# Patient Record
Sex: Female | Born: 1940 | Race: Black or African American | Hispanic: No | Marital: Married | Smoking: Former smoker
Health system: Southern US, Community
[De-identification: ages and names within clinical notes are randomized; demographics above are authoritative.]

## PROBLEM LIST (undated history)

## (undated) DIAGNOSIS — I10 Essential (primary) hypertension: Secondary | ICD-10-CM

## (undated) DIAGNOSIS — R0602 Shortness of breath: Secondary | ICD-10-CM

## (undated) DIAGNOSIS — I2510 Atherosclerotic heart disease of native coronary artery without angina pectoris: Secondary | ICD-10-CM

## (undated) DIAGNOSIS — E785 Hyperlipidemia, unspecified: Secondary | ICD-10-CM

## (undated) DIAGNOSIS — D693 Immune thrombocytopenic purpura: Secondary | ICD-10-CM

## (undated) DIAGNOSIS — T733XXA Exhaustion due to excessive exertion, initial encounter: Secondary | ICD-10-CM

## (undated) HISTORY — DX: Atherosclerotic heart disease of native coronary artery without angina pectoris: I25.10

## (undated) HISTORY — DX: Shortness of breath: R06.02

## (undated) HISTORY — PX: OTHER SURGICAL HISTORY: SHX169

## (undated) HISTORY — DX: Exhaustion due to excessive exertion, initial encounter: T73.3XXA

## (undated) HISTORY — DX: Hyperlipidemia, unspecified: E78.5

## (undated) HISTORY — DX: Immune thrombocytopenic purpura: D69.3

## (undated) HISTORY — DX: Essential (primary) hypertension: I10

---

## 1998-12-12 ENCOUNTER — Other Ambulatory Visit: Admission: RE | Admit: 1998-12-12 | Discharge: 1998-12-12 | Payer: Self-pay | Admitting: Obstetrics and Gynecology

## 1998-12-27 ENCOUNTER — Other Ambulatory Visit: Admission: RE | Admit: 1998-12-27 | Discharge: 1998-12-27 | Payer: Self-pay | Admitting: Obstetrics and Gynecology

## 1999-12-18 ENCOUNTER — Other Ambulatory Visit: Admission: RE | Admit: 1999-12-18 | Discharge: 1999-12-18 | Payer: Self-pay | Admitting: Obstetrics and Gynecology

## 2000-01-21 ENCOUNTER — Other Ambulatory Visit: Admission: RE | Admit: 2000-01-21 | Discharge: 2000-01-21 | Payer: Self-pay | Admitting: Obstetrics & Gynecology

## 2000-01-22 ENCOUNTER — Other Ambulatory Visit: Admission: RE | Admit: 2000-01-22 | Discharge: 2000-01-22 | Payer: Self-pay | Admitting: Obstetrics & Gynecology

## 2000-01-22 ENCOUNTER — Encounter (INDEPENDENT_AMBULATORY_CARE_PROVIDER_SITE_OTHER): Payer: Self-pay | Admitting: Specialist

## 2000-09-16 ENCOUNTER — Ambulatory Visit (HOSPITAL_COMMUNITY): Admission: RE | Admit: 2000-09-16 | Discharge: 2000-09-16 | Payer: Self-pay | Admitting: Internal Medicine

## 2000-09-16 ENCOUNTER — Encounter: Payer: Self-pay | Admitting: Internal Medicine

## 2000-10-22 ENCOUNTER — Ambulatory Visit (HOSPITAL_COMMUNITY): Admission: RE | Admit: 2000-10-22 | Discharge: 2000-10-22 | Payer: Self-pay | Admitting: Gastroenterology

## 2000-12-17 ENCOUNTER — Other Ambulatory Visit: Admission: RE | Admit: 2000-12-17 | Discharge: 2000-12-17 | Payer: Self-pay | Admitting: Obstetrics and Gynecology

## 2001-02-10 ENCOUNTER — Other Ambulatory Visit: Admission: RE | Admit: 2001-02-10 | Discharge: 2001-02-10 | Payer: Self-pay | Admitting: Obstetrics and Gynecology

## 2001-02-11 ENCOUNTER — Other Ambulatory Visit: Admission: RE | Admit: 2001-02-11 | Discharge: 2001-02-11 | Payer: Self-pay | Admitting: Obstetrics and Gynecology

## 2001-02-11 ENCOUNTER — Encounter (INDEPENDENT_AMBULATORY_CARE_PROVIDER_SITE_OTHER): Payer: Self-pay

## 2002-01-04 ENCOUNTER — Other Ambulatory Visit: Admission: RE | Admit: 2002-01-04 | Discharge: 2002-01-04 | Payer: Self-pay | Admitting: Obstetrics and Gynecology

## 2002-03-26 ENCOUNTER — Ambulatory Visit (HOSPITAL_COMMUNITY): Admission: RE | Admit: 2002-03-26 | Discharge: 2002-03-26 | Payer: Self-pay | Admitting: Orthopaedic Surgery

## 2002-03-26 ENCOUNTER — Encounter: Payer: Self-pay | Admitting: Orthopaedic Surgery

## 2003-01-07 ENCOUNTER — Other Ambulatory Visit: Admission: RE | Admit: 2003-01-07 | Discharge: 2003-01-07 | Payer: Self-pay | Admitting: Obstetrics and Gynecology

## 2003-02-21 ENCOUNTER — Ambulatory Visit (HOSPITAL_COMMUNITY): Admission: RE | Admit: 2003-02-21 | Discharge: 2003-02-21 | Payer: Self-pay | Admitting: Obstetrics and Gynecology

## 2003-02-21 ENCOUNTER — Encounter (INDEPENDENT_AMBULATORY_CARE_PROVIDER_SITE_OTHER): Payer: Self-pay | Admitting: *Deleted

## 2003-05-30 ENCOUNTER — Other Ambulatory Visit: Admission: RE | Admit: 2003-05-30 | Discharge: 2003-05-30 | Payer: Self-pay | Admitting: Obstetrics and Gynecology

## 2004-01-11 ENCOUNTER — Other Ambulatory Visit: Admission: RE | Admit: 2004-01-11 | Discharge: 2004-01-11 | Payer: Self-pay | Admitting: Obstetrics and Gynecology

## 2007-03-27 ENCOUNTER — Inpatient Hospital Stay (HOSPITAL_COMMUNITY): Admission: RE | Admit: 2007-03-27 | Discharge: 2007-03-28 | Payer: Self-pay | Admitting: Cardiovascular Disease

## 2007-06-18 ENCOUNTER — Encounter (HOSPITAL_COMMUNITY): Admission: RE | Admit: 2007-06-18 | Discharge: 2007-07-29 | Payer: Self-pay | Admitting: Cardiovascular Disease

## 2007-07-30 ENCOUNTER — Encounter (HOSPITAL_COMMUNITY): Admission: RE | Admit: 2007-07-30 | Discharge: 2007-10-28 | Payer: Self-pay | Admitting: Cardiovascular Disease

## 2007-10-29 ENCOUNTER — Encounter (HOSPITAL_COMMUNITY): Admission: RE | Admit: 2007-10-29 | Discharge: 2007-11-02 | Payer: Self-pay | Admitting: Cardiovascular Disease

## 2009-03-11 ENCOUNTER — Emergency Department (HOSPITAL_COMMUNITY): Admission: EM | Admit: 2009-03-11 | Discharge: 2009-03-11 | Payer: Self-pay | Admitting: Emergency Medicine

## 2009-06-13 ENCOUNTER — Emergency Department (HOSPITAL_COMMUNITY): Admission: EM | Admit: 2009-06-13 | Discharge: 2009-06-13 | Payer: Self-pay | Admitting: Emergency Medicine

## 2009-07-31 ENCOUNTER — Emergency Department (HOSPITAL_COMMUNITY): Admission: EM | Admit: 2009-07-31 | Discharge: 2009-07-31 | Payer: Self-pay | Admitting: Emergency Medicine

## 2009-11-08 ENCOUNTER — Ambulatory Visit: Payer: Self-pay | Admitting: Oncology

## 2009-11-22 LAB — COMPREHENSIVE METABOLIC PANEL
ALT: 25 U/L (ref 0–35)
Albumin: 3.8 g/dL (ref 3.5–5.2)
CO2: 24 mEq/L (ref 19–32)
Calcium: 9.1 mg/dL (ref 8.4–10.5)
Chloride: 106 mEq/L (ref 96–112)
Glucose, Bld: 182 mg/dL — ABNORMAL HIGH (ref 70–99)
Potassium: 4.3 mEq/L (ref 3.5–5.3)
Sodium: 138 mEq/L (ref 135–145)
Total Bilirubin: 0.5 mg/dL (ref 0.3–1.2)
Total Protein: 6.9 g/dL (ref 6.0–8.3)

## 2009-11-22 LAB — CBC WITH DIFFERENTIAL/PLATELET
BASO%: 0.5 % (ref 0.0–2.0)
Basophils Absolute: 0 10*3/uL (ref 0.0–0.1)
EOS%: 0.4 % (ref 0.0–7.0)
Eosinophils Absolute: 0 10*3/uL (ref 0.0–0.5)
HCT: 40.8 % (ref 34.8–46.6)
HGB: 14.2 g/dL (ref 11.6–15.9)
LYMPH%: 19.7 % (ref 14.0–49.7)
MCH: 33.4 pg (ref 25.1–34.0)
MCHC: 34.7 g/dL (ref 31.5–36.0)
MCV: 96.2 fL (ref 79.5–101.0)
MONO#: 0.3 10*3/uL (ref 0.1–0.9)
MONO%: 4.7 % (ref 0.0–14.0)
NEUT#: 4.5 10*3/uL (ref 1.5–6.5)
NEUT%: 74.7 % (ref 38.4–76.8)
Platelets: 238 10*3/uL (ref 145–400)
RBC: 4.24 10*6/uL (ref 3.70–5.45)
RDW: 13.1 % (ref 11.2–14.5)
WBC: 6 10*3/uL (ref 3.9–10.3)
lymph#: 1.2 10*3/uL (ref 0.9–3.3)

## 2009-11-22 LAB — CHCC SMEAR

## 2009-11-24 LAB — KAPPA/LAMBDA LIGHT CHAINS
Kappa free light chain: 0.87 mg/dL (ref 0.33–1.94)
Kappa:Lambda Ratio: 0.76 (ref 0.26–1.65)

## 2009-11-24 LAB — SPEP & IFE WITH QIG
Albumin ELP: 56.4 % (ref 55.8–66.1)
Alpha-1-Globulin: 8.7 % — ABNORMAL HIGH (ref 2.9–4.9)
Beta 2: 4.5 % (ref 3.2–6.5)
Beta Globulin: 6.7 % (ref 4.7–7.2)
IgA: 160 mg/dL (ref 68–378)
Total Protein, Serum Electrophoresis: 7 g/dL (ref 6.0–8.3)

## 2009-11-29 LAB — UIFE/LIGHT CHAINS/TP QN, 24-HR UR
Albumin, U: DETECTED
Alpha 1, Urine: DETECTED — AB
Alpha 2, Urine: DETECTED — AB
Beta, Urine: DETECTED — AB
Free Kappa Lt Chains,Ur: 0.79 mg/dL (ref 0.04–1.51)
Free Lambda Lt Chains,Ur: <0.04 mg/dL — ABNORMAL LOW (ref 0.08–1.01)
Free Lt Chn Excr Rate: 7.51 mg/d
Time: 24 hours
Total Protein, Urine-Ur/day: 12 mg/d (ref 10–140)
Total Protein, Urine: 1.3 mg/dL
Volume, Urine: 950 mL

## 2009-12-01 ENCOUNTER — Ambulatory Visit (HOSPITAL_COMMUNITY): Admission: RE | Admit: 2009-12-01 | Discharge: 2009-12-01 | Payer: Self-pay | Admitting: Oncology

## 2010-02-12 ENCOUNTER — Ambulatory Visit: Payer: Self-pay | Admitting: Oncology

## 2010-05-15 ENCOUNTER — Ambulatory Visit: Payer: Self-pay | Admitting: Oncology

## 2010-10-31 LAB — CBC
HCT: 35.8 % — ABNORMAL LOW (ref 36.0–46.0)
Hemoglobin: 12.4 g/dL (ref 12.0–15.0)
RDW: 12.5 % (ref 11.5–15.5)
WBC: 9 10*3/uL (ref 4.0–10.5)

## 2010-10-31 LAB — URINALYSIS, ROUTINE W REFLEX MICROSCOPIC
Glucose, UA: NEGATIVE mg/dL
Hgb urine dipstick: NEGATIVE
Protein, ur: NEGATIVE mg/dL
Specific Gravity, Urine: 1.011 (ref 1.005–1.030)

## 2010-10-31 LAB — BASIC METABOLIC PANEL
Chloride: 104 mEq/L (ref 96–112)
GFR calc non Af Amer: 53 mL/min — ABNORMAL LOW (ref >60)
Glucose, Bld: 94 mg/dL (ref 70–99)
Potassium: 3.7 mEq/L (ref 3.5–5.1)
Sodium: 137 mEq/L (ref 135–145)

## 2010-10-31 LAB — DIFFERENTIAL
Eosinophils Relative: 2 % (ref 0–5)
Lymphocytes Relative: 11 % — ABNORMAL LOW (ref 12–46)
Lymphs Abs: 1 10*3/uL (ref 0.7–4.0)
Monocytes Absolute: 0.4 10*3/uL (ref 0.1–1.0)

## 2010-10-31 LAB — POCT CARDIAC MARKERS
CKMB, poc: 2.4 ng/mL (ref 1.0–8.0)
Myoglobin, poc: 311 ng/mL (ref 12–200)
Troponin i, poc: <0.05 ng/mL (ref 0.00–0.09)

## 2010-11-03 LAB — POCT URINALYSIS DIP (DEVICE)
Glucose, UA: NEGATIVE mg/dL
Nitrite: POSITIVE — AB
Urobilinogen, UA: 1 mg/dL (ref 0.0–1.0)

## 2010-11-03 LAB — URINE CULTURE

## 2010-12-11 NOTE — Cardiovascular Report (Signed)
[name], [name]           ACCOUNT NO.:  [id number]   MEDICAL RECORD NO.:  [id number]          PATIENT TYPE:  OIB   LOCATION:  2807                         FACILITY:  [facility]   PHYSICIAN:  Nicki Guadalajara, M.D.     DATE OF BIRTH:  [birth year]   DATE OF PROCEDURE:  03/26/2007  DATE OF DISCHARGE:                            CARDIAC CATHETERIZATION   Jamie Mejia is a 70 year old African American female who has a  history of hypertension, family history of coronary artery disease, and  recently developed exertional dyspnea with vague chest pressure.  She  was found to be significantly hyperlipidemic and started on Crestor 20  mg.  Definitive cardiac catheterization was done earlier today at  [facility].  This revealed calcification of the left main  LAD system with 50% narrowing in a twin diagonal branch of the LAD.  She  has 50-70% diffuse narrowing in the second obtuse marginal branch of the  circumflex vessel.  Right coronary artery was diffusely diseased.  There  was 95-99% stenosis in the mid segment followed by 70-80% stenosis.  The  60-70% stenosis after the crux.  The PDA initially appeared fairly small  caliber.  She was also found to have 70-80% focal left renal artery  stenosis.  Because of her subtotal RCA stenosis even though she was not  having chest pain the decision was made to bring her to [facility] this afternoon for elective percutaneous coronary artery  intervention of her subtotal RCA lesion.  The patient was hydrated with  175 mL of saline after her diagnostic catheterization at the [facility].  Upon arrival to the short stay area, she was given 300 mg  of oral Plavix.   PROCEDURE:  After premedication with Valium 5 mg intravenous, the  patient was prepped and draped in usual fashion.  Her right femoral  artery was punctured anteriorly and a 6-French sheath was inserted.  Initially, an LR-4 guide was inserted.  The  Integralin double bolus and  drip was started and the patient was given 3500 units of heparin.  She  was also administered IC nitroglycerin and started on IV nitroglycerin  drip.  Initially an ATW wire was attempted to be advanced down the right  coronary artery but this was unable to cross the lesion.  A 2-0 Maverick  balloon x 12 mm was then inserted but again this was unable to traverse  the 90 degrees angle proximally and there was significant difficulty  with catheter backup.  Consequently, the entire system was removed and a  hockey-stick with side hole 6-French guide was inserted.  This did  provide additional support, but still there was some difficulty in  access.  Ultimately a long Luge wire was inserted.  Again this was  unable to cross the lesion but ultimately once the 2-0 balloon was then  inserted the subtotal lesion was ultimately able to be crossed.  The  wire was advanced distally.  Dilatation was done up to 8 atmospheres for  several inflations at the high-grade subtotal lesion.  The balloon was  then pulled back  and removed and exchanged for a 2.5 x 15 mm balloon.  However, there was again difficult backup and when this balloon was  attempted to enter the system, the wire pulled back and lesion access  was lost.  A new wire was then inserted.  There was significant  difficulty again in attempting to re-cross the lesion.  A Prowater wire  was then inserted but again this was unsuccessful in crossing the  lesion.  Ultimately, a new Luge wire was then advanced and the initial 2-  0 balloon was ultimately successful again in allowing the lesion to be  recrossed.  Multiple dilatations again were made with the 2-0 balloon.  This balloon was then removed and a 2-5 x 15 mm balloon was then able to  be advanced to the lesion with successive dilatations up to 12  atmospheres.  Due to the sharp angle in the proximal right coronary  artery it was felt that the PROMOS stent would be  most deliverable.  A 3-  0 x 23 mm PROMOS drug-eluting stent was then successfully inserted and  deployed to 15 atmospheres.  The post-stent dilatation was done  ultimately with a  3.25 x 20 mm Quantum balloon with dilatation up to  3.3 mm.  During the procedure, the patient received numerous doses of IC  nitroglycerin.  She also received an additional 25 mg fentanyl.  With  balloon inflation, she did not experience any significant chest  discomfort.  She tolerated the procedure well.   HEMODYNAMIC DATA:  Central aortic pressure was 147/72.   ANGIOGRAPHIC DATA:  The right coronary artery had a somewhat upper  takeoff, long proximal segment and then had a 90 degree takeoff in areas  where small anterior branches arose.  There was a 50% lesion prior to a  marginal branch and then the RCA was 99% stenosed.  There was aneurysmal  dilatation above and below this stenosed segment followed by 80%  narrowing.  There was 40% narrowing beyond the crux.  There appeared to  be at least diffuse 40% narrowing in the PDA.  Following a difficult but  ultimately successful coronary intervention to the right coronary artery  the diffusely diseased proximal segment was successfully angioplastied  and stented ultimately with a 3-0 x 23 mm PROMUS stent postdilated to  3.3 mm with resultant stenosis being reduced to 0%.  There is no change  in the more distal RCA lesions.   IMPRESSION:  Difficult but successful percutaneous coronary artery  intervention to the right coronary artery with diffuse 99% 80% stenoses  ultimately being reduced to 0% with insertion of a 3-0 x 3.0 x 23-mm  drug-eluting new PROMUS stent postdilated 3.3 cm,  done with double  bolus Integralin/weight adjusted heparinization and oral clopidogrel.           ______________________________  Nicki Guadalajara, M.D.     TK/MEDQ  D:  03/26/2007  T:  03/27/2007  Job:  [id number]   cc:   Harrel Lemon. Merla Riches, M.D.  [facility]. Lab.

## 2010-12-14 NOTE — Discharge Summary (Signed)
NAMEFRANCI, Jamie Mejia           ACCOUNT NO.:  [id number]   MEDICAL RECORD NO.:  [id number]          PATIENT TYPE:  INP   LOCATION:  6527                         FACILITY:  [facility]   PHYSICIAN:  Jamie Mejia, M.D.     DATE OF BIRTH:  [birth year]   DATE OF ADMISSION:  03/26/2007  DATE OF DISCHARGE:  03/28/2007                               DISCHARGE SUMMARY   DISCHARGE DIAGNOSES:  1. Non-ST-elevation myocardial infarction, minimal.  2. Coronary artery disease with 95.99% right coronary artery stenosis,      undergoing percutaneous transluminal cardiac angioplasty and stent      by Dr. Tresa Mejia.  3. Hypertension.  4. Family history of coronary disease.  5. Angina.  6. Gastroesophageal reflux disease.  7. A 70-80% left renal artery stenosis.   DISCHARGE CONDITION:  Improved.   PROCEDURES:  Initially had cardiac catheterization at [facility]  March 26, 2007, and was transferred to [facility] where she underwent PTCA  and stent deployment to the right coronary artery for tight stenosis.   DISCHARGE MEDICATIONS:  1. Toprol XL 50 mg daily.  2. Imdur 30 mg daily.  3. Aspirin 325 daily.  4. Nexium 40 mg daily.  5. Wellbutrin XL150 mg daily.  6. Crestor 20 mg daily.  7. Benicar/hydrochlorothiazide 20/12.5 daily.  8. If you are unable to get the Nexium, use Prilosec over-the-counter      one daily for 1-2 weeks.  9. Plavix 75 mg daily.  Do not stop.  10.Nitroglycerin under your tongue as needed for chest pain.   DISCHARGE INSTRUCTIONS:  1. May shower or bathe.  2. No lifting for 2 days.  3. No driving for 2 days.  4. Increase activity slowly.  5. Low-sodium, heart-healthy diet.  6. Wash right groin catheterization site with soap and water.  7. Call if any bleeding, swelling or drainage.  8. See Dr. Tresa Mejia back in 2 weeks.  9. She is to bring all her medications with her to the doctor's      appointments as there was still some confusion concerning      medications.   HISTORY OF  PRESENT ILLNESS:  Ms. Jamie Mejia is a 70 year old patient of Dr.  Tresa Mejia who originally was seen in 2008. Because of chest discomfort, she  was scheduled for stress Myoview which suggested mild perfusion defect  in the inferior wall with possible inferior lateral ischemia.  Post  stress EF was 79%. She also on echocardiogram revealed mild mitral  annular calcification with mild MR, trace TR, and mild aortic valve  sclerosis but no stenosis.  Due to her chest discomfort, Toprol XL was  added to her medical regimen, and she underwent cardiac catheterization  at [facility] with Dr. Tresa Mejia. At that time, she was found to have  95.99% RCA stenosis and was brought to [facility] the same day and  underwent PCI with stent to that vessel.   Please see both dictated reports. By the next morning, she was feeling  good with no chest pain or shortness of breath.  Unfortunately, troponin  had peaked, and it was felt she needed to stay  in the hospital which she  did.  By the next day, she was ambulating the hall without any further  problems and was ready for discharge home.  She was seen and examined by  Dr. Lynnea Mejia and discharged.   VITAL SIGNS AT DISCHARGE:  Blood pressure 135/60, pulse 69, temperature  98.6, respirations 18.   Alert, oriented.  Heart:  Regular rate and rhythm.  Lungs were clear.  Groin stable without hematoma.   LABORATORY DATA:  Hemoglobin 11.9, hematocrit 34.7, WBC 9, platelets  218.  Sodium 141, potassium 3.9, chloride 108, CO2 26, glucose 96, BUN  8, creatinine 0.95   Cardiac enzymes: CK ranged 136, 151, 134. MB 4.7, 5.1, and 3.2.  Troponin I 0.16, 0.28, and then down to 0.2 prior to discharge.   The patient will follow up with Dr. Tresa Mejia.  She walked with cardiac  rehab and did well on the morning of discharge. She had no further  complaints and felt quite well.      Jamie Mejia. Jamie Mejia, N.P.    ______________________________  Jamie Mejia, M.D.    LRI/MEDQ  D:   03/31/2007  T:  03/31/2007  Job:  [id number]   cc:   Jamie Parkin, MD

## 2010-12-14 NOTE — Procedures (Signed)
[place]  Patient:    Jamie Mejia, Jamie Mejia                    MRN: [id number] Proc. Date: 10/22/00 Adm. Date:  [id number] Attending:  Charna Elizabeth CC:         Merlene Laughter. [name], M.D.   Procedure Report  DATE OF BIRTH:  Feb 19, 1941.  PROCEDURE:  Colonoscopy.  ENDOSCOPIST:  Anselmo Rod, M.D.  INSTRUMENT USED:  Olympus video colonoscope.  INDICATION FOR PROCEDURE:  Rectal bleeding and a history of polyps in a 70 year old African-American female.  Rule out recurrent polyps.  PREPROCEDURE PREPARATION:  Informed consent was procured from the patient. The patient was fasted for eight hours prior to the procedure and prepped with a bottle of magnesium citrate and a gallon of NuLytely the night prior to the procedure.  PREPROCEDURE PHYSICAL:  VITAL SIGNS:  The patient had stable vital signs.  NECK:  Supple.  CHEST:  Clear to auscultation.  S1, S2 regular.  ABDOMEN:  Soft with normal abdominal bowel sounds.  DESCRIPTION OF PROCEDURE:  The patient was placed in the left lateral decubitus position and sedated with 50 mg of Demerol and 6 mg of Versed intravenously.  Once the patient was adequately sedate and maintained on low-flow oxygen and continuous cardiac monitoring, the Olympus video colonoscope was advanced from the rectum to the cecum without difficulty.  The entire colonic mucosa appeared healthy with normal vascular pattern.  No masses, polyps, erosions, ulcerations, or diverticula were seen.  Small internal hemorrhoids were appreciated on retroflexion.  IMPRESSION:  Healthy-appearing colon except for small, nonbleeding internal hemorrhoids.  RECOMMENDATIONS: 1. Repeat colorectal cancer screening is recommended in the next five years    unless the patient were to have any abdominal symptoms in the interim. 2. If the patients rectal bleeding does not subside with high-fiber diet and    liberal fluid intake, other workup  will be needed.  The patient is to    contact the office at the earliest. 3. Outpatient follow-up is advised on a p.r.n. basis. DD:  10/22/00 TD:  10/23/00 Job: [id number]

## 2010-12-14 NOTE — Op Note (Signed)
Jamie Mejia, Jamie Mejia                     ACCOUNT NO.:  [id number]   MEDICAL RECORD NO.:  [id number]                   PATIENT TYPE:  AMB   LOCATION:  SDC                                  FACILITY:  [facility]   PHYSICIAN:  Hal Morales, M.D.             DATE OF BIRTH:  Oct 02, 1940   DATE OF PROCEDURE:  02/21/2003  DATE OF DISCHARGE:                                 OPERATIVE REPORT   PREOPERATIVE DIAGNOSES:  High grade squamous intra-epithelial lesion,  cervical stenosis, inadequate colposcopy.   POSTOPERATIVE DIAGNOSES:  High grade squamous intra-epithelial lesion,  cervical stenosis, inadequate colposcopy.   OPERATION/PROCEDURE:  Cone knife conization of the cervix.   ANESTHESIA:  General.   ESTIMATED BLOOD LOSS:  Less than 10 cc.   COMPLICATIONS:  None.   FINDINGS:  There were no visible lesions on the exocervix per the previous  colposcopy.  The cervical os was stenotic and precluded obtaining and endo-  cervical curetting.  The transition zone could not be fully visualized at  the time of colposcopy.   DESCRIPTION OF PROCEDURE:  The patient was taken to the operating room after  appropriate identification and placed on the operating table.  After the  obtainment of adequate anesthesia, she was placed in a lithotomy position.  The peroneum and vagina were prepped in multiple layers with Betadine and  draped as a sterile field.  A Grave's speculum was placed into the vagina  and a single-toothed tenaculum placed on the cervix outside the expected  transition zone.  A dilute solution of Pitressin was used to infiltrate the  cervix.  Hemostatic sutures were placed at the 3 and 9 o'clock position and  tied down.  A cone-shaped specimen was then excised including the endo-  cervical canal and was removed from the operative field.  The endocervical  canal was then curetted and sent as a separate specimen.  Hemostatic  Sturmdorf sutures were placed at the 6 and 12 o'clock  positions and  hemostasis noted to be adequate.  The lower portion of the cervix was  oversewn at the cut edge to allow for adequate hemostasis.  A portion of  Gelfoam was placed in the conization bed.  Hemostasis was adequate and all  instruments were removed from the operative field.  All sutures used were 0  Vicryl.  The patient was awakened from general anesthesia and taken to the  recovery room in satisfactory condition having tolerated the procedure well  with sponge and instrument counts were correct.   SPECIMENS TO PATHOLOGY:  Conization specimen with suture at the 12 o'clock  position and endo-cervical specimen.                                               Hal Morales, M.D.   VPH/MEDQ  D:  02/21/2003  T:  02/21/2003  Job:  [id number]

## 2011-05-10 LAB — CARDIAC PANEL(CRET KIN+CKTOT+MB+TROPI)
CK, MB: 3.2
CK, MB: 4.7 — ABNORMAL HIGH
CK, MB: 5.1 — ABNORMAL HIGH
Relative Index: 3.5 — ABNORMAL HIGH
Total CK: 134
Troponin I: 0.24 — ABNORMAL HIGH

## 2011-05-10 LAB — BASIC METABOLIC PANEL
CO2: 26
GFR calc Af Amer: >60
GFR calc non Af Amer: 59 — ABNORMAL LOW
Glucose, Bld: 96
Potassium: 3.9
Sodium: 141

## 2011-05-10 LAB — CBC
HCT: 32.2 — ABNORMAL LOW
Hemoglobin: 11.2 — ABNORMAL LOW
Hemoglobin: 11.9 — ABNORMAL LOW
MCHC: 34.8
RBC: 3.36 — ABNORMAL LOW
RBC: 3.61 — ABNORMAL LOW
RDW: 12.7

## 2012-04-08 ENCOUNTER — Other Ambulatory Visit: Payer: Self-pay | Admitting: Internal Medicine

## 2012-04-08 ENCOUNTER — Ambulatory Visit
Admission: RE | Admit: 2012-04-08 | Discharge: 2012-04-08 | Disposition: A | Discharge status: Home / Self Care | Payer: Medicare Other | Source: Ambulatory Visit | Attending: Internal Medicine | Admitting: Internal Medicine

## 2012-04-08 DIAGNOSIS — R079 Chest pain, unspecified: Secondary | ICD-10-CM

## 2013-02-26 ENCOUNTER — Telehealth: Payer: Self-pay | Admitting: Hematology & Oncology

## 2013-02-26 NOTE — Telephone Encounter (Signed)
REFERRAL FAXED TO [facility].

## 2013-03-01 ENCOUNTER — Ambulatory Visit (HOSPITAL_BASED_OUTPATIENT_CLINIC_OR_DEPARTMENT_OTHER): Payer: Medicare Other | Admitting: Hematology & Oncology

## 2013-03-01 ENCOUNTER — Ambulatory Visit: Payer: Medicare Other

## 2013-03-01 ENCOUNTER — Other Ambulatory Visit (HOSPITAL_BASED_OUTPATIENT_CLINIC_OR_DEPARTMENT_OTHER): Payer: Medicare Other | Admitting: Lab

## 2013-03-01 VITALS — BP 102/61 | HR 71 | Temp 97.9°F | Resp 20 | Ht 58.5 in | Wt 144.0 lb

## 2013-03-01 DIAGNOSIS — I2510 Atherosclerotic heart disease of native coronary artery without angina pectoris: Secondary | ICD-10-CM

## 2013-03-01 DIAGNOSIS — D693 Immune thrombocytopenic purpura: Secondary | ICD-10-CM

## 2013-03-01 DIAGNOSIS — D696 Thrombocytopenia, unspecified: Secondary | ICD-10-CM

## 2013-03-01 DIAGNOSIS — I10 Essential (primary) hypertension: Secondary | ICD-10-CM

## 2013-03-01 LAB — CBC WITH DIFFERENTIAL (CANCER CENTER ONLY)
BASO%: 0.5 % (ref 0.0–2.0)
EOS%: 8 % — ABNORMAL HIGH (ref 0.0–7.0)
Eosinophils Absolute: 0.5 10*3/uL (ref 0.0–0.5)
LYMPH%: 39.3 % (ref 14.0–48.0)
MCH: 32 pg (ref 26.0–34.0)
MCV: 92 fL (ref 81–101)
MONO%: 9.7 % (ref 0.0–13.0)
NEUT#: 2.8 10*3/uL (ref 1.5–6.5)
Platelets: 10 10*3/uL — CL (ref 145–400)
RBC: 4.35 10*6/uL (ref 3.70–5.32)
RDW: 12.4 % (ref 11.1–15.7)

## 2013-03-01 LAB — CHCC SATELLITE - SMEAR

## 2013-03-01 NOTE — Progress Notes (Signed)
This office note has been dictated.

## 2013-03-02 LAB — LUPUS ANTICOAGULANT PANEL
DRVVT: 31.5 s
Lupus Anticoagulant: NOT DETECTED
PTT Lupus Anticoagulant: 35 s (ref 28.0–43.0)

## 2013-03-02 LAB — ANA: Anti Nuclear Antibody(ANA): NEGATIVE

## 2013-03-02 LAB — RHEUMATOID FACTOR: Rheumatoid fact SerPl-aCnc: <10 [IU]/mL

## 2013-03-02 LAB — CARDIOLIPIN ANTIBODIES, IGG, IGM, IGA
Anticardiolipin IgA: 9 APL U/mL (ref <22)
Anticardiolipin IgG: 5 GPL U/mL (ref <23)
Anticardiolipin IgM: 17 MPL U/mL — ABNORMAL HIGH (ref <11)

## 2013-03-02 NOTE — Progress Notes (Signed)
CC:   Jamie Mejia. [name], M.D.  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  HISTORY OF PRESENT ILLNESS:  Jamie Mejia is a very charming 72 year old African American female.  She is followed by Dr. Kellie Shropshire.  Her husband recently underwent a stem cell transplant at [facility] for myeloma.  Jamie Mejia has been doing fairly well.  She has not had any real acute issues going on.  She has had no recent viral syndromes.  She has had no problems with bleeding or bruising.  She was seen by Dr. [name].  She had some lab work done back in July. Shockingly enough, her platelet count was found to be quite low.  Back on 07/18, her white cell count was 6.6, hemoglobin 14.7, hematocrit 31.2, platelet count 25,000.  She had normal electrolytes.  Calcium was 10.1.  Protein was 7.5.  She had normal LFTs.  On 07/29, a repeat CBC was done.  This showed a white count of 5.2, hemoglobin 15.5, hematocrit 40.1, platelet count was 17,000.  MCV was 91.  With this, it was felt that she needed to see hematology.  She was kindly referred to [facility] for an evaluation.  Again, she has had no bleeding.  There may be some bruising.  She has had no rashes.  The patient is on aspirin and Plavix for a cardiac stent that was placed a couple years ago.  She has had no weight loss or weight gain.  She does state she has had hot flashes for the past 2 months.  She has not had these before.  There has been no change in bowel or bladder habits.  She has been getting routine mammograms.  She is due I think later on this year.  She denies any change in her medications.  Overall, her performance status is quite good (ECOG 1).  PAST MEDICAL HISTORY: 1. Coronary artery disease, status post cardiac stent. 2. Hyperlipidemia. 3. Hypertension. 4. 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 5. Arthritis. 6. Anxiety.  ALLERGIES: 1. Latex. 2. Peanut butter.  MEDICATIONS:  Xanax  1 mg p.o. b.i.d. p.r.n., aspirin 81 mg p.o. daily, Wellbutrin XL 150 mg p.o. daily, Plavix 75 mg p.o. daily, Imdur 30 mg p.o. daily, Toprol-XL 25 mg p.o. daily, Zocor 20 mg p.o. daily, Flexeril 10 mg p.o. q.h.s. p.r.n.  SOCIAL HISTORY:  Remarkable for past tobacco use.  She probably stopped about I think 10 years ago.  She says that she may light up one if she has stress.  There is no alcohol use.  She has had no occupational exposures.  She is retired from the administration at [employer].  FAMILY HISTORY:  Pretty much noncontributory.  There is no obvious history of blood problems in the family.  REVIEW OF SYSTEMS:  As stated in the history of present illness.  No additional findings are noted on a 12-system review.  PHYSICAL EXAMINATION:  General:  This is a well-developed, well- nourished African American female in no obvious distress.  Vital signs: Temperature of 97.9, pulse 71, respiratory 20, blood pressure 102/61. Weight is 144.  Head and neck:  Normocephalic, atraumatic skull.  There are no ocular or oral lesions.  There are no palpable cervical or supraclavicular lymph nodes.  Lungs:  Clear bilaterally.  Cardiac: Regular rate and rhythm with a normal S1 and S2.  There are no murmurs, rubs or bruits.  Abdomen:  Soft.  She has good bowel sounds.  There is no fluid wave.  There is no  palpable hepatosplenomegaly.  Back:  Shows no tenderness over the spine, ribs, or hips.  Extremities:  Show no clubbing, cyanosis or edema.  Skin:  Shows no rashes, ecchymosis, or petechia.  Neurological:  Shows no focal neurological deficits.  LABORATORY STUDIES:  White cell count is 6.6, hemoglobin 14, hematocrit 40.1, platelet count 10.  White cell differential shows 43 segs, 39 lymphs, 10 monos.  Peripheral smear shows a normochromic, normocytic population of red blood cells.  There is no schistocytes.  I see no nucleated red blood cells.  There is no rouleaux formation.  She has no  target cells.  White cells appear normal in morphology and maturation.  There is no immature myeloid or lymphoid forms.  There is no hypersegmented polys.  There is no atypical lymphocytes.  I see no blasts.  Platelets are markedly decreased in number.  She has several large platelets.  Platelets are well granulated.  IMPRESSION:  Jamie Mejia is a very charming, 72 year old African American female with profound thrombocytopenia.  This appears to be acute in onset. Of note, back in January, from what Ms. Krinsky says, her platelet count was fine. The last lab work that I have on her previously was back in April 2011. At that point in time, her platelet count was 238.  By the peripheral blood smear, one would have to think that this is a consumption or peripheral destruction process.  She has a good amount of large platelets.  This would indicate that the bone marrow is making platelets but that they are just being destroyed quickly once they get into the circulation.  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  She is not the typical age that we see this.  I find it interesting that she may have a "connective tissue disorder." Her daughter died of lupus.  I am sending an ANA on her.  I forgot to mention that back in 2011, she saw Dr. Gaylyn Rong.  She had an M- spike at that point in time.  It did not look like she had any obvious plasma cell disorder and that she was just monitored.  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  I really think that we need to treat this as such.  She does not want prednisone.  Apparently, she was placed on prednisone before and did not do too well with this.  I am going to try her on IVIG.  I will see about giving her 3 days worth of IVIG.  I will give some Decadron with the IVIG to see if this does not also help.  If we do not see a platelet  response with IVIG, then we are going to have to consider her for a bone marrow biopsy.  Again, given that she is not anemic, or that her MCV is not high, I would not think that she would have a myelodysplastic process.  I do not see anything that would suggest a microangiographic process since she is not anemic.  I spent a good hour and a half with her.  Her husband is recovering from his stem cell transplant.  He is doing well.  We will start her this week on IVIG.  We have to watch her platelet count weekly.  I will plan to see her back myself in 4 weeks or sooner, if necessary.    ______________________________ Rose Phi  Myna Hidalgo, M.D. PRE/MEDQ  D:  03/01/2013  T:  03/02/2013  Job:  [id number]

## 2013-03-03 ENCOUNTER — Ambulatory Visit (HOSPITAL_BASED_OUTPATIENT_CLINIC_OR_DEPARTMENT_OTHER): Payer: Medicare Other

## 2013-03-03 VITALS — BP 119/72 | HR 60 | Temp 98.0°F | Resp 20

## 2013-03-03 DIAGNOSIS — D693 Immune thrombocytopenic purpura: Secondary | ICD-10-CM

## 2013-03-03 MED ORDER — ACETAMINOPHEN 325 MG PO TABS
650.0000 mg | ORAL_TABLET | ORAL | Status: DC
  Administered 2013-03-03: 650 mg via ORAL

## 2013-03-03 MED ORDER — SODIUM CHLORIDE 0.9 % IV SOLN
Freq: Once | INTRAVENOUS | Status: AC
  Administered 2013-03-03: 09:00:00 via INTRAVENOUS

## 2013-03-03 MED ORDER — DEXAMETHASONE SODIUM PHOSPHATE 20 MG/5ML IJ SOLN
20.0000 mg | INTRAMUSCULAR | Status: DC
  Administered 2013-03-03: 20 mg via INTRAVENOUS
  Filled 2013-03-03: qty 5

## 2013-03-03 MED ORDER — DIPHENHYDRAMINE HCL 25 MG PO CAPS
25.0000 mg | ORAL_CAPSULE | ORAL | Status: DC
  Administered 2013-03-03: 25 mg via ORAL

## 2013-03-03 MED ORDER — IMMUNE GLOBULIN (HUMAN) 20 GM/200ML IV SOLN
0.6000 g/kg | INTRAVENOUS | Status: DC
  Administered 2013-03-03: 40 g via INTRAVENOUS
  Filled 2013-03-03: qty 400

## 2013-03-03 NOTE — Patient Instructions (Addendum)

## 2013-03-04 ENCOUNTER — Ambulatory Visit (HOSPITAL_BASED_OUTPATIENT_CLINIC_OR_DEPARTMENT_OTHER): Payer: Medicare Other

## 2013-03-04 VITALS — BP 143/64 | HR 57 | Temp 98.0°F

## 2013-03-04 DIAGNOSIS — D693 Immune thrombocytopenic purpura: Secondary | ICD-10-CM

## 2013-03-04 DIAGNOSIS — D696 Thrombocytopenia, unspecified: Secondary | ICD-10-CM

## 2013-03-04 MED ORDER — DIPHENHYDRAMINE HCL 25 MG PO CAPS
25.0000 mg | ORAL_CAPSULE | Freq: Once | ORAL | Status: AC
  Administered 2013-03-04: 25 mg via ORAL

## 2013-03-04 MED ORDER — SODIUM CHLORIDE 0.9 % IV SOLN
Freq: Once | INTRAVENOUS | Status: AC
  Administered 2013-03-04: 09:00:00 via INTRAVENOUS

## 2013-03-04 MED ORDER — IMMUNE GLOBULIN (HUMAN) 20 GM/200ML IV SOLN
600.0000 mg/kg | INTRAVENOUS | Status: DC
  Administered 2013-03-04: 40 g via INTRAVENOUS
  Filled 2013-03-04: qty 400

## 2013-03-04 MED ORDER — ACETAMINOPHEN 325 MG PO TABS
650.0000 mg | ORAL_TABLET | ORAL | Status: DC
  Administered 2013-03-04: 650 mg via ORAL

## 2013-03-04 MED ORDER — DIPHENHYDRAMINE HCL 50 MG/ML IJ SOLN: 25.0000 mg | INTRAMUSCULAR | Status: DC

## 2013-03-04 NOTE — Patient Instructions (Addendum)

## 2013-03-05 ENCOUNTER — Ambulatory Visit (HOSPITAL_BASED_OUTPATIENT_CLINIC_OR_DEPARTMENT_OTHER): Payer: Medicare Other

## 2013-03-05 VITALS — BP 136/76 | HR 60 | Temp 98.1°F

## 2013-03-05 DIAGNOSIS — D696 Thrombocytopenia, unspecified: Secondary | ICD-10-CM

## 2013-03-05 DIAGNOSIS — D693 Immune thrombocytopenic purpura: Secondary | ICD-10-CM

## 2013-03-05 MED ORDER — IMMUNE GLOBULIN (HUMAN) 20 GM/200ML IV SOLN
600.0000 mg/kg | Freq: Once | INTRAVENOUS | Status: AC
  Administered 2013-03-05: 40 g via INTRAVENOUS
  Filled 2013-03-05: qty 400

## 2013-03-05 MED ORDER — IMMUNE GLOBULIN (HUMAN) 20 GM/200ML IV SOLN
600.0000 mg/kg | Freq: Once | INTRAVENOUS | Status: DC
  Filled 2013-03-05: qty 400

## 2013-03-05 MED ORDER — ACETAMINOPHEN 325 MG PO TABS
650.0000 mg | ORAL_TABLET | Freq: Once | ORAL | Status: AC
  Administered 2013-03-05: 650 mg via ORAL

## 2013-03-05 MED ORDER — DIPHENHYDRAMINE HCL 25 MG PO CAPS
25.0000 mg | ORAL_CAPSULE | Freq: Once | ORAL | Status: AC
  Administered 2013-03-05: 25 mg via ORAL

## 2013-03-05 NOTE — Patient Instructions (Addendum)

## 2013-03-08 ENCOUNTER — Other Ambulatory Visit (HOSPITAL_BASED_OUTPATIENT_CLINIC_OR_DEPARTMENT_OTHER): Payer: Medicare Other | Admitting: Lab

## 2013-03-08 DIAGNOSIS — D696 Thrombocytopenia, unspecified: Secondary | ICD-10-CM

## 2013-03-08 LAB — CBC WITH DIFFERENTIAL (CANCER CENTER ONLY)
BASO#: 0 10*3/uL (ref 0.0–0.2)
EOS%: 3.1 % (ref 0.0–7.0)
HCT: 38.5 % (ref 34.8–46.6)
HGB: 13.4 g/dL (ref 11.6–15.9)
LYMPH#: 2.2 10*3/uL (ref 0.9–3.3)
MCH: 32 pg (ref 26.0–34.0)
MCHC: 34.8 g/dL (ref 32.0–36.0)
NEUT%: 38.6 % — ABNORMAL LOW (ref 39.6–80.0)

## 2013-03-15 ENCOUNTER — Telehealth: Payer: Self-pay | Admitting: *Deleted

## 2013-03-15 ENCOUNTER — Other Ambulatory Visit (HOSPITAL_BASED_OUTPATIENT_CLINIC_OR_DEPARTMENT_OTHER): Payer: Medicare Other | Admitting: Lab

## 2013-03-15 DIAGNOSIS — D696 Thrombocytopenia, unspecified: Secondary | ICD-10-CM

## 2013-03-15 LAB — CBC WITH DIFFERENTIAL (CANCER CENTER ONLY)
BASO#: 0 10*3/uL (ref 0.0–0.2)
EOS%: 3.4 % (ref 0.0–7.0)
HGB: 13.9 g/dL (ref 11.6–15.9)
LYMPH%: 43.2 % (ref 14.0–48.0)
MCH: 31.6 pg (ref 26.0–34.0)
MCHC: 34.2 g/dL (ref 32.0–36.0)
MONO%: 9.3 % (ref 0.0–13.0)
NEUT#: 2.3 10*3/uL (ref 1.5–6.5)
NEUT%: 43.7 % (ref 39.6–80.0)

## 2013-03-15 NOTE — Telephone Encounter (Signed)
Left message on home voicemail (tried mobile as well) stating her platelets were normal at 247 but Dr. Myna Hidalgo wants to know what dose of Prednisone she is taking. Asked that she call back with her current dose.

## 2013-03-16 ENCOUNTER — Telehealth: Payer: Self-pay | Admitting: *Deleted

## 2013-03-16 NOTE — Telephone Encounter (Signed)
Pt left message stating she was no longer taking Prednisone. Reviewed with Dr Myna Hidalgo. She was switched from Prednisone to IVIG. To let her know that she has responded to the IVIG. Does not need to come in for 03/22/13 lab appt but to keep 03/31/13 lab & MD appt. Pt verbalized understanding.

## 2013-03-22 ENCOUNTER — Other Ambulatory Visit: Payer: Medicare Other | Admitting: Lab

## 2013-03-30 ENCOUNTER — Other Ambulatory Visit: Payer: Medicare Other | Admitting: Lab

## 2013-03-31 ENCOUNTER — Ambulatory Visit (HOSPITAL_BASED_OUTPATIENT_CLINIC_OR_DEPARTMENT_OTHER): Payer: Medicare (Managed Care) | Admitting: Hematology & Oncology

## 2013-03-31 ENCOUNTER — Encounter: Payer: Self-pay | Admitting: Hematology & Oncology

## 2013-03-31 ENCOUNTER — Other Ambulatory Visit (HOSPITAL_BASED_OUTPATIENT_CLINIC_OR_DEPARTMENT_OTHER): Payer: Medicare (Managed Care) | Admitting: Lab

## 2013-03-31 VITALS — BP 128/66 | HR 69 | Temp 98.0°F | Resp 16 | Ht 59.0 in | Wt 146.0 lb

## 2013-03-31 DIAGNOSIS — D693 Immune thrombocytopenic purpura: Secondary | ICD-10-CM

## 2013-03-31 DIAGNOSIS — D696 Thrombocytopenia, unspecified: Secondary | ICD-10-CM

## 2013-03-31 HISTORY — DX: Immune thrombocytopenic purpura: D69.3

## 2013-03-31 LAB — CBC WITH DIFFERENTIAL (CANCER CENTER ONLY)
BASO%: 0.2 % (ref 0.0–2.0)
EOS%: 10.5 % — ABNORMAL HIGH (ref 0.0–7.0)
MCH: 32.3 pg (ref 26.0–34.0)
MCHC: 34.6 g/dL (ref 32.0–36.0)
MONO%: 9.7 % (ref 0.0–13.0)
NEUT#: 2 10*3/uL (ref 1.5–6.5)
Platelets: 31 10*3/uL — ABNORMAL LOW (ref 145–400)
RBC: 4.18 10*6/uL (ref 3.70–5.32)
RDW: 12.9 % (ref 11.1–15.7)

## 2013-03-31 NOTE — Progress Notes (Signed)
This office note has been dictated.

## 2013-04-01 NOTE — Progress Notes (Signed)
CC:   Merlene Laughter. [name], M.D.  DIAGNOSIS:  Relapsed chronic immune thrombocytopenia.  CURRENT THERAPY:  Observation.  INTERIM HISTORY:  Ms. Lehane comes in for followup.  She did incredibly well with her first cycle of IVIG.  Her platelet count went up quite nicely.  When we first saw her, her platelet count was, I think, less than 10.  We gave her IVIG.  Platelet count was up to 229,000.  She cannot tolerate steroids.  As such, this is not an option for her.  She has had no bleeding or bruising.  She has had no fatigue or weakness.  She had a very nice Labor Day weekend.  She has had no leg swelling.  There has been no change in bowel or bladder habits.  The patient denies any type of cough or shortness of breath.  PHYSICAL EXAMINATION:  General:  This is a well-developed, well- nourished, African American female in no obvious distress.  Vital signs: Temperature of 98.6, pulse 69, respiratory rate 16, blood pressure 128/66.  Weight is 146.  Head and neck:  Normocephalic, atraumatic skull.  There are no ocular or oral lesions.  She has no palpable cervical or supraclavicular lymph nodes.  Lungs:  Clear to percussion and auscultation bilaterally.  Cardiac:  Regular rate and rhythm with a normal S1 and S2.  There are no murmurs, rubs or bruits.  Abdomen: Soft.  She has good bowel sounds.  There is no fluid wave.  There is no palpable hepatosplenomegaly.  No tenderness over the spine, ribs, or hips.  Extremities:  Show no clubbing, cyanosis or edema.  She has good range motion of her joints.  Skin:  Shows no rashes, ecchymosis, or petechia.  LABORATORY STUDIES:  White cell count is 5.9, hemoglobin 13.5, hematocrit 39, platelet count 31,000.  MCV is 93.  Peripheral smear shows no schistocytes.  There are no spherocytes.  I see no nucleated red blood cells.  She has no teardrop cells.  There is no rouleaux formation.  White cells appear normal in morphology and maturation.  She  has no hypersegmented polys.  Platelets are decreased in number.  She has several large platelets.  Platelets are well granulated.  IMPRESSION:  Ms. Broder is a very charming 72 year old African American female with relapsed immune thrombocytopenia. She responded incredibly well to IVIG.  As such, we will try this again.  She cannot tolerate long term steroids.  As such, this is not an option for [place].  I think that the other options would be Rituxan or a splenectomy.  I spoke to her about both.  She just wants to go with the IVIG for right now.  When we did the IVIG last, we did give her some Decadron with each dose.  We will continue to follow her platelet count weekly now.  We will get her set up next week for the IVIG.  She knows to give [place] a call if she has any kind of bleeding or bruising issues.  I want see her back myself in about 4 weeks time.  This should let us know if she responded again.  Ultimately, my preference would be to see about getting her spleen taken out.  I think this would be a very reasonable option and one that probably would give her the best chance of long-term remission.    ______________________________ Josph Macho, M.D. PRE/MEDQ  D:  03/31/2013  T:  04/01/2013  Job:  [id number]

## 2013-04-08 ENCOUNTER — Ambulatory Visit (HOSPITAL_BASED_OUTPATIENT_CLINIC_OR_DEPARTMENT_OTHER): Payer: Medicare (Managed Care)

## 2013-04-08 ENCOUNTER — Ambulatory Visit: Payer: Medicare (Managed Care)

## 2013-04-08 VITALS — BP 122/81 | HR 70 | Temp 97.9°F | Resp 20

## 2013-04-08 DIAGNOSIS — D693 Immune thrombocytopenic purpura: Secondary | ICD-10-CM

## 2013-04-08 MED ORDER — IMMUNE GLOBULIN (HUMAN) 10 GM/100ML IV SOLN: 1.0000 g/kg | Freq: Once | INTRAVENOUS | Status: DC

## 2013-04-08 MED ORDER — ACETAMINOPHEN 325 MG PO TABS
650.0000 mg | ORAL_TABLET | Freq: Four times a day (QID) | ORAL | Status: DC | PRN
  Administered 2013-04-08: 650 mg via ORAL

## 2013-04-08 MED ORDER — SODIUM CHLORIDE 0.9 % IV SOLN
Freq: Once | INTRAVENOUS | Status: AC
  Administered 2013-04-08: 09:00:00 via INTRAVENOUS

## 2013-04-08 MED ORDER — DEXAMETHASONE SODIUM PHOSPHATE 20 MG/5ML IJ SOLN
20.0000 mg | Freq: Once | INTRAMUSCULAR | Status: DC
  Filled 2013-04-08: qty 5

## 2013-04-08 MED ORDER — DIPHENHYDRAMINE HCL 25 MG PO TABS
25.0000 mg | ORAL_TABLET | Freq: Once | ORAL | Status: AC
  Administered 2013-04-08: 25 mg via ORAL
  Filled 2013-04-08: qty 1

## 2013-04-08 MED ORDER — IMMUNE GLOBULIN (HUMAN) 20 GM/200ML IV SOLN
1.0000 g/kg | Freq: Once | INTRAVENOUS | Status: AC
  Administered 2013-04-08: 60 g via INTRAVENOUS
  Filled 2013-04-08: qty 600

## 2013-04-09 ENCOUNTER — Ambulatory Visit (HOSPITAL_BASED_OUTPATIENT_CLINIC_OR_DEPARTMENT_OTHER): Payer: Medicare (Managed Care)

## 2013-04-09 ENCOUNTER — Ambulatory Visit: Payer: Medicare (Managed Care)

## 2013-04-09 VITALS — BP 171/87 | HR 80 | Temp 98.1°F | Resp 20

## 2013-04-09 DIAGNOSIS — D693 Immune thrombocytopenic purpura: Secondary | ICD-10-CM

## 2013-04-09 MED ORDER — DIPHENHYDRAMINE HCL 25 MG PO CAPS
25.0000 mg | ORAL_CAPSULE | Freq: Once | ORAL | Status: AC
  Administered 2013-04-09: 25 mg via ORAL

## 2013-04-09 MED ORDER — SODIUM CHLORIDE 0.9 % IV SOLN
Freq: Once | INTRAVENOUS | Status: AC
  Administered 2013-04-09: 10:00:00 via INTRAVENOUS

## 2013-04-09 MED ORDER — IMMUNE GLOBULIN (HUMAN) 20 GM/200ML IV SOLN
60.0000 g | Freq: Once | INTRAVENOUS | Status: AC
  Administered 2013-04-09: 60 g via INTRAVENOUS
  Filled 2013-04-09: qty 600

## 2013-04-09 MED ORDER — DEXAMETHASONE SODIUM PHOSPHATE 20 MG/5ML IJ SOLN
20.0000 mg | Freq: Once | INTRAMUSCULAR | Status: AC
  Administered 2013-04-09: 20 mg via INTRAVENOUS
  Filled 2013-04-09: qty 5

## 2013-04-09 MED ORDER — ACETAMINOPHEN 325 MG PO TABS
650.0000 mg | ORAL_TABLET | Freq: Once | ORAL | Status: AC
  Administered 2013-04-09: 650 mg via ORAL

## 2013-04-09 NOTE — Patient Instructions (Signed)

## 2013-04-14 ENCOUNTER — Other Ambulatory Visit: Payer: Self-pay | Admitting: Cardiovascular Disease

## 2013-04-15 NOTE — Telephone Encounter (Signed)
Rx was sent to pharmacy electronically. 

## 2013-04-16 ENCOUNTER — Other Ambulatory Visit (HOSPITAL_BASED_OUTPATIENT_CLINIC_OR_DEPARTMENT_OTHER): Payer: Medicare (Managed Care) | Admitting: Lab

## 2013-04-16 DIAGNOSIS — D693 Immune thrombocytopenic purpura: Secondary | ICD-10-CM

## 2013-04-16 LAB — CBC WITH DIFFERENTIAL (CANCER CENTER ONLY)
EOS%: 10.2 % — ABNORMAL HIGH (ref 0.0–7.0)
HCT: 40.7 % (ref 34.8–46.6)
LYMPH%: 47.3 % (ref 14.0–48.0)
MCH: 32.1 pg (ref 26.0–34.0)
MCHC: 33.9 g/dL (ref 32.0–36.0)
MCV: 95 fL (ref 81–101)
MONO#: 0.7 10*3/uL (ref 0.1–0.9)
NEUT%: 29.6 % — ABNORMAL LOW (ref 39.6–80.0)
RDW: 13.4 % (ref 11.1–15.7)
WBC: 5.6 10*3/uL (ref 3.9–10.0)

## 2013-04-23 ENCOUNTER — Other Ambulatory Visit (HOSPITAL_BASED_OUTPATIENT_CLINIC_OR_DEPARTMENT_OTHER): Payer: Medicare (Managed Care) | Admitting: Lab

## 2013-04-23 DIAGNOSIS — D693 Immune thrombocytopenic purpura: Secondary | ICD-10-CM

## 2013-04-23 LAB — CBC WITH DIFFERENTIAL (CANCER CENTER ONLY)
BASO#: 0 10*3/uL (ref 0.0–0.2)
Eosinophils Absolute: 0.5 10*3/uL (ref 0.0–0.5)
HGB: 13.4 g/dL (ref 11.6–15.9)
MCH: 33 pg (ref 26.0–34.0)
MCV: 93 fL (ref 81–101)
MONO#: 0.4 10*3/uL (ref 0.1–0.9)
MONO%: 7.4 % (ref 0.0–13.0)
NEUT#: 1.9 10*3/uL (ref 1.5–6.5)
Platelets: 155 10*3/uL (ref 145–400)
RBC: 4.06 10*6/uL (ref 3.70–5.32)
WBC: 5.3 10*3/uL (ref 3.9–10.0)

## 2013-04-30 ENCOUNTER — Other Ambulatory Visit (HOSPITAL_BASED_OUTPATIENT_CLINIC_OR_DEPARTMENT_OTHER): Payer: Medicare (Managed Care) | Admitting: Lab

## 2013-04-30 ENCOUNTER — Ambulatory Visit (HOSPITAL_BASED_OUTPATIENT_CLINIC_OR_DEPARTMENT_OTHER): Payer: Medicare (Managed Care) | Admitting: Hematology & Oncology

## 2013-04-30 VITALS — BP 125/67 | HR 67 | Temp 97.7°F | Resp 14 | Ht 59.0 in | Wt 147.0 lb

## 2013-04-30 DIAGNOSIS — D693 Immune thrombocytopenic purpura: Secondary | ICD-10-CM

## 2013-04-30 LAB — CBC WITH DIFFERENTIAL (CANCER CENTER ONLY)
BASO#: 0 10*3/uL (ref 0.0–0.2)
Eosinophils Absolute: 0.6 10*3/uL — ABNORMAL HIGH (ref 0.0–0.5)
HCT: 38.9 % (ref 34.8–46.6)
LYMPH%: 46.7 % (ref 14.0–48.0)
MCH: 32 pg (ref 26.0–34.0)
MCV: 94 fL (ref 81–101)
MONO#: 0.4 10*3/uL (ref 0.1–0.9)
MONO%: 8.4 % (ref 0.0–13.0)
NEUT%: 31.6 % — ABNORMAL LOW (ref 39.6–80.0)
Platelets: 71 10*3/uL — ABNORMAL LOW (ref 145–400)
RBC: 4.15 10*6/uL (ref 3.70–5.32)
WBC: 4.3 10*3/uL (ref 3.9–10.0)

## 2013-04-30 NOTE — Patient Instructions (Addendum)

## 2013-04-30 NOTE — Progress Notes (Signed)
CC:   Jamie Mejia. Jamie Mejia, M.D.  DIAGNOSIS:  Relapsed chronic immune thrombocytopenia.  CURRENT THERAPY:  The patient is status post second cycle of IVIg.  INTERIM HISTORY:  Jamie Mejia comes in for followup.  She had her second cycle of IVIg a couple of weeks ago.  She seemed to tolerate this fairly well.  Her platelet count went up really nicely.  On September 19th, her platelet count was up 265.  Unfortunately, __________ to come down slowly.  On 09/26, it was 155.  Today, it is 71,000.  She has had no bleeding.  She has had no bruising.  There has been no change in her medications.  She tolerated the IVIg very nicely.  She has had no fevers.  There has been no rash.  There has been no cough or shortness of breath.  She has had no leg swelling.  PHYSICAL EXAMINATION:  General:  This is a well-developed, well- nourished African American female in no obvious distress.  Vital signs: Temperature of 97.7, pulse 67, respiratory rate 14, blood pressure 125/__________.  Weight is 147.  Head and neck:  Shows a normocephalic, atraumatic skull.  There are no ocular or oral lesions.  There are no palpable cervical or supraclavicular lymph nodes.  Lungs:  Clear bilaterally.  Cardiac:  Regular rate and rhythm with a normal S1 and S2. There are no murmurs, rubs or bruits.  Abdomen:  Soft.  She has good bowel sounds.  There is no palpable abdominal mass.  There is no fluid wave.  There is no palpable hepatosplenomegaly.  Extremities:  Show no clubbing, cyanosis or edema.  Neurological:  Shows no focal neurological deficit.  LABORATORY STUDIES:  White cell count 4.3, hemoglobin 13.3, hematocrit 38.9, platelet count 71,000.  Peripheral smear shows a slight decrease in her platelet count. Platelets are well granulated.  She has several large platelets.  She has no schistocytes or spherocytes.  White cells appear normal in morphology and maturation.  There are no atypical  lymphocytes.  IMPRESSION:  Jamie Mejia is a very charming 72 year old African American female.  She has chronic immune thrombocytopenia.  She has responded very well to IVIg.  Unfortunately, this response the second time has not been nearly as per impressive.  She is already starting to regress.  I talked to her about the other options now.  We have Rituxan.  We have splenectomy.  Also to be considered, is Nplate.  She really want to try to avoid surgery.  Personally, I think surgery with her spleen being taken out would be the easiest way to treat her and probably the most effective.  She wants to consider Rituxan.  We went ahead and gave her information about Rituxan.  I explained to her that Rituxan blocks the white blood cells from making the protein that attacks the platelets.  I told her that Rituxan will be given once a week for 4 weeks.  Again, if she does not want Rituxan, I really think a splenectomy would be the way to go.  We will go ahead and plan to get her back weekly for her CBC.  I will see her back myself in 4-6 weeks.    ______________________________ Josph Macho, M.D. PRE/MEDQ  D:  04/30/2013  T:  04/30/2013  Job:  [id number]

## 2013-04-30 NOTE — Progress Notes (Signed)
This office note has been dictated.

## 2013-05-07 ENCOUNTER — Other Ambulatory Visit: Payer: Self-pay | Admitting: Hematology & Oncology

## 2013-05-07 ENCOUNTER — Telehealth: Payer: Self-pay | Admitting: Hematology & Oncology

## 2013-05-07 ENCOUNTER — Other Ambulatory Visit (HOSPITAL_BASED_OUTPATIENT_CLINIC_OR_DEPARTMENT_OTHER): Payer: Medicare (Managed Care) | Admitting: Lab

## 2013-05-07 DIAGNOSIS — D693 Immune thrombocytopenic purpura: Secondary | ICD-10-CM

## 2013-05-07 LAB — CBC WITH DIFFERENTIAL (CANCER CENTER ONLY)
BASO%: 0.2 % (ref 0.0–2.0)
HCT: 40.2 % (ref 34.8–46.6)
LYMPH%: 39.7 % (ref 14.0–48.0)
MCH: 32.3 pg (ref 26.0–34.0)
MCHC: 34.8 g/dL (ref 32.0–36.0)
MCV: 93 fL (ref 81–101)
MONO#: 0.5 10*3/uL (ref 0.1–0.9)
MONO%: 8.8 % (ref 0.0–13.0)
NEUT%: 36.2 % — ABNORMAL LOW (ref 39.6–80.0)
Platelets: 40 10*3/uL — ABNORMAL LOW (ref 145–400)
RDW: 12.9 % (ref 11.1–15.7)

## 2013-05-07 NOTE — Telephone Encounter (Signed)
Patient was here today for lab apt.  Per Rn Amy to cx 10/17, 10/24, 11/7, 11/14 apts and resch for 10/15, 10/28, 11/4, and 11/11 with patient.  Patient prefer Tuesdays for sch apts

## 2013-05-12 ENCOUNTER — Other Ambulatory Visit (HOSPITAL_BASED_OUTPATIENT_CLINIC_OR_DEPARTMENT_OTHER): Payer: Medicare (Managed Care) | Admitting: Lab

## 2013-05-12 ENCOUNTER — Other Ambulatory Visit: Payer: Medicare (Managed Care) | Admitting: Lab

## 2013-05-12 ENCOUNTER — Ambulatory Visit (HOSPITAL_BASED_OUTPATIENT_CLINIC_OR_DEPARTMENT_OTHER): Payer: Medicare (Managed Care)

## 2013-05-12 VITALS — BP 126/72 | HR 76 | Temp 98.0°F | Resp 18

## 2013-05-12 DIAGNOSIS — D693 Immune thrombocytopenic purpura: Secondary | ICD-10-CM

## 2013-05-12 DIAGNOSIS — Z5112 Encounter for antineoplastic immunotherapy: Secondary | ICD-10-CM

## 2013-05-12 LAB — CBC WITH DIFFERENTIAL (CANCER CENTER ONLY)
BASO%: 0.6 % (ref 0.0–2.0)
EOS%: 12 % — ABNORMAL HIGH (ref 0.0–7.0)
HCT: 41.4 % (ref 34.8–46.6)
LYMPH#: 2 10*3/uL (ref 0.9–3.3)
LYMPH%: 39 % (ref 14.0–48.0)
MCHC: 34.8 g/dL (ref 32.0–36.0)
MONO#: 0.5 10*3/uL (ref 0.1–0.9)
NEUT%: 38 % — ABNORMAL LOW (ref 39.6–80.0)
Platelets: 47 10*3/uL — ABNORMAL LOW (ref 145–400)
RDW: 12.9 % (ref 11.1–15.7)
WBC: 5.2 10*3/uL (ref 3.9–10.0)

## 2013-05-12 MED ORDER — ACETAMINOPHEN 325 MG PO TABS
650.0000 mg | ORAL_TABLET | Freq: Once | ORAL | Status: AC
  Administered 2013-05-12: 650 mg via ORAL

## 2013-05-12 MED ORDER — DIPHENHYDRAMINE HCL 25 MG PO CAPS
50.0000 mg | ORAL_CAPSULE | Freq: Once | ORAL | Status: AC
  Administered 2013-05-12: 50 mg via ORAL

## 2013-05-12 MED ORDER — DEXAMETHASONE SODIUM PHOSPHATE 20 MG/5ML IJ SOLN
20.0000 mg | Freq: Once | INTRAMUSCULAR | Status: AC
  Administered 2013-05-12: 20 mg via INTRAVENOUS

## 2013-05-12 MED ORDER — RITUXIMAB CHEMO INJECTION 10 MG/ML
375.0000 mg/m2 | Freq: Once | INTRAVENOUS | Status: AC
  Administered 2013-05-12: 600 mg via INTRAVENOUS
  Filled 2013-05-12: qty 60

## 2013-05-12 MED ORDER — DIPHENHYDRAMINE HCL 25 MG PO CAPS
ORAL_CAPSULE | ORAL | Status: AC
  Filled 2013-05-12: qty 2

## 2013-05-12 MED ORDER — ACETAMINOPHEN 325 MG PO TABS
ORAL_TABLET | ORAL | Status: AC
  Filled 2013-05-12: qty 2

## 2013-05-12 MED ORDER — SODIUM CHLORIDE 0.9 % IV SOLN
Freq: Once | INTRAVENOUS | Status: AC
  Administered 2013-05-12: 10:00:00 via INTRAVENOUS

## 2013-05-12 MED ORDER — DEXAMETHASONE SODIUM PHOSPHATE 10 MG/ML IJ SOLN
INTRAMUSCULAR | Status: AC
  Filled 2013-05-12: qty 1

## 2013-05-12 NOTE — Patient Instructions (Signed)

## 2013-05-14 ENCOUNTER — Other Ambulatory Visit: Payer: Medicare (Managed Care) | Admitting: Lab

## 2013-05-17 ENCOUNTER — Encounter: Payer: Self-pay | Admitting: Hematology & Oncology

## 2013-05-19 ENCOUNTER — Encounter: Payer: Self-pay | Admitting: Cardiovascular Disease

## 2013-05-19 ENCOUNTER — Ambulatory Visit (INDEPENDENT_AMBULATORY_CARE_PROVIDER_SITE_OTHER): Payer: Medicare (Managed Care) | Admitting: Cardiovascular Disease

## 2013-05-19 VITALS — BP 100/60 | HR 74 | Ht <= 58 in | Wt 146.3 lb

## 2013-05-19 DIAGNOSIS — E785 Hyperlipidemia, unspecified: Secondary | ICD-10-CM | POA: Insufficient documentation

## 2013-05-19 DIAGNOSIS — I10 Essential (primary) hypertension: Secondary | ICD-10-CM

## 2013-05-19 DIAGNOSIS — I2510 Atherosclerotic heart disease of native coronary artery without angina pectoris: Secondary | ICD-10-CM

## 2013-05-19 NOTE — Patient Instructions (Signed)
Your physician wants you to follow-up in 12 Dr Tresa Endo.  You will receive a reminder letter in the mail two months in advance. If you don't receive a letter, please call our office to schedule the follow-up appointment.  Continue current medications

## 2013-05-19 NOTE — Progress Notes (Signed)
Patient ID: Jamie Mejia, female   DOB: February 15, 1941, 72 y.o.   MRN: [id number]     HPI: Jamie Mejia is a 72 y.o. female who presents to the office for one year cardiology evaluation.  Ms. Shrider has established coronary artery disease in August 2008 underwent stenting of her proximal right current artery and was found to have concomitant CAD with 60-70% distal narrowing, 50-70% circumflex O2 stenosis, 50% stenosis in the stomach LAD vessel. She did have coronary calcification involving her left main and LAD system. She underwent a two-year followup nuclear perfusion study in October 2013 which continued to be stable without scar or ischemia. She had hyperdynamic LV function.  Additional problems include hypertension, hyperlipidemia, as well as mild renal artery stenosis by duplex imaging. She was felt to have narrowing in the 1-59% range in her left renal artery with a proximal PSV of 196.  Ms. Balla sees Dr. Andi Devon at 3 month intervals. She states laboratory has been checked recently.  Ms. Rickles denies chest pain. She denies tachycardia palpitations. She has been caring for her husband who underwent a bone marrow transplant for multiple myeloma.  Past Medical History  Diagnosis Date  . 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 Hypertension   . Hyperlipemia     Past Surgical History  Procedure Laterality Date  . Coronary artery stenting      stenting to mid right coronary artery at which time was noted  to have concomitant coronary artery disease with 60% to 70% distal narrowing, 50%to 70% circumflex OM2 stenosis, and 50% stenosis in a twin-like LAD segment    Allergies  Allergen Reactions  . Latex Itching  . Peanut Butter Flavor Hives    Current Outpatient Prescriptions  Medication Sig Dispense Refill  . ALPRAZolam (XANAX) 1 MG tablet Take 1 mg by mouth at bedtime as needed for sleep.       Marland Kitchen buPROPion (WELLBUTRIN XL) 150 MG 24 hr tablet  Take 150 mg by mouth daily.      . Cholecalciferol (VITAMIN D3) 2000 UNITS TABS Take 2,000 Units by mouth daily.      . clopidogrel (PLAVIX) 75 MG tablet Take 75 mg by mouth daily.      . Cyanocobalamin (VITAMIN B 12 PO) Take by mouth every morning.      . isosorbide mononitrate (IMDUR) 60 MG 24 hr tablet TAKE 1 TABLET EVERY DAY  30 tablet  1  . loratadine (CLARITIN) 10 MG tablet Take 10 mg by mouth daily.      . metoprolol succinate (TOPROL-XL) 25 MG 24 hr tablet Take 25 mg by mouth daily.      . Probiotic Product (ALIGN) 4 MG CAPS Take 4 mg by mouth daily.      . simvastatin (ZOCOR) 20 MG tablet Take 20 mg by mouth daily.       No current facility-administered medications for this visit.    History   Social History  . Marital Status: Married    Spouse Name: N/A    Number of Children: N/A  . Years of Education: N/A   Occupational History  . Not on file.   Social History Main Topics  . Smoking status: Former Smoker    Quit date: 04/28/2002  . Smokeless tobacco: Not on file  . Alcohol Use: Not on file  . Drug Use: Not on file  . Sexual Activity: Not on file   Other Topics Concern  . Not on file  Social History Narrative  . No narrative on file    Family History  Problem Relation Age of Onset  . Heart disease Mother   . Heart disease Brother    Additional social history is notable that she is married. She does not routinely exercise. There is no tobacco or alcohol use.  ROS is negative for fevers, chills or night sweats. She denies any visual changes. She denies wheezing. There is no PND or orthopnea. She denies sputum production or cough. She denies presyncope or syncope. She denies chest pressure. She denies nausea vomiting or diarrhea. She denies change in bowel or bladder habits. She denies hematuria or hematochezia. There is no claudication. She denies paresthesias. She denies tremors. He denies heat or cold intolerance. She tolerates Zocor without myalgias  Other  comprehensive 12 point system review is negative.  PE BP 100/60  Pulse 74  Ht 4\' 8"  (1.422 m)  Wt 146 lb 4.8 oz (66.361 kg)  BMI 32.82 kg/m2  General: Alert, oriented, no distress.  Skin: normal turgor, no rashes HEENT: Normocephalic, atraumatic. Pupils round and reactive; sclera anicteric;no lid lag.  Nose without nasal septal hypertrophy Mouth/Parynx benign; Mallinpatti scale 2/3 Neck: No JVD, no carotid briuts Lungs: clear to ausculatation and percussion; no wheezing or rales Heart: RRR, s1 s2 normal 1/6 systolic murmur. No S3 gallop. Abdomen: Mild central adiposity. I did not hear any renal artery bruits. soft, nontender; no hepatosplenomehaly, BS+; abdominal aorta nontender and not dilated by palpation. Pulses 2+ Extremities: no clubbing cyanosis or edema, Homan's sign negative  Neurologic: grossly nonfocal Psychologic: normal affect and mood.  ECG: Sinus rhythm with mild sinus arrhythmia. Normal intervals.  LABS:  BMET    Component Value Date/Time   NA 138 11/22/2009 1458   K 4.3 11/22/2009 1458   CL 106 11/22/2009 1458   CO2 24 11/22/2009 1458   GLUCOSE 182* 11/22/2009 1458   BUN 14 11/22/2009 1458   CREATININE 1.16 11/22/2009 1458   CALCIUM 9.1 11/22/2009 1458   GFRNONAA 53* 06/13/2009 1055   GFRAA  Value: >60        The eGFR has been calculated using the MDRD equation. This calculation has not been validated in all clinical situations. eGFR's persistently <60 mL/min signify possible Chronic Kidney Disease. 06/13/2009 1055     Hepatic Function Panel     Component Value Date/Time   PROT 6.9 11/22/2009 1458   ALBUMIN 3.8 11/22/2009 1458   AST 27 11/22/2009 1458   ALT 25 11/22/2009 1458   ALKPHOS 48 11/22/2009 1458   BILITOT 0.5 11/22/2009 1458     CBC    Component Value Date/Time   WBC 5.2 05/12/2013 0849   WBC 6.0 11/22/2009 1458   WBC 9.0 06/13/2009 1055   RBC 4.24 11/22/2009 1458   RBC 3.76* 06/13/2009 1055   HGB 14.4 05/12/2013 0849   HGB 14.2 11/22/2009 1458     HGB 12.4 06/13/2009 1055   HCT 41.4 05/12/2013 0849   HCT 40.8 11/22/2009 1458   HCT 35.8* 06/13/2009 1055   PLT 47* 05/12/2013 0849   PLT 238 11/22/2009 1458   PLT 323 06/13/2009 1055   MCV 93 05/12/2013 0849   MCV 96.2 11/22/2009 1458   MCV 95.1 06/13/2009 1055   MCH 32.2 05/12/2013 0849   MCH 33.4 11/22/2009 1458   MCHC 34.8 05/12/2013 0849   MCHC 34.7 11/22/2009 1458   MCHC 34.6 06/13/2009 1055   RDW 12.9 05/12/2013 0849   RDW 13.1 11/22/2009 1458  RDW 12.5 06/13/2009 1055   LYMPHSABS 2.0 05/12/2013 0849   LYMPHSABS 1.2 11/22/2009 1458   LYMPHSABS 1.0 06/13/2009 1055   MONOABS 0.3 11/22/2009 1458   MONOABS 0.4 06/13/2009 1055   EOSABS 0.6* 05/12/2013 0849   EOSABS 0.0 11/22/2009 1458   EOSABS 0.1 06/13/2009 1055   BASOSABS 0.0 05/12/2013 0849   BASOSABS 0.0 11/22/2009 1458   BASOSABS 0.0 06/13/2009 1055     BNP No results found for this basename: probnp    Lipid Panel  No results found for this basename: chol, trig, hdl, cholhdl, vldl, ldlcalc     RADIOLOGY: No results found.    ASSESSMENT AND PLAN: From a cardiac standpoint, Ms. Lenig is now 6 years status post PCI to her mid right coronary artery high-grade stenosis. She does have concomitant CAD which has been treated medically and her last nuclear perfusion study in October 2013 continued to show normal perfusion. Her blood pressure is well-controlled. After S[place] has been following up her laboratory. She has not had anginal symptoms. There are no signs of CHF. Margo Aye of [place] result of her most recent laboratory. As long as she remained stable, I will see her in one year for followup evaluation.     Lennette Bihari, MD, [facility]  05/19/2013 3:59 PM

## 2013-05-20 ENCOUNTER — Encounter: Payer: Self-pay | Admitting: Cardiovascular Disease

## 2013-05-21 ENCOUNTER — Other Ambulatory Visit: Payer: Medicare (Managed Care) | Admitting: Lab

## 2013-05-25 ENCOUNTER — Ambulatory Visit (HOSPITAL_BASED_OUTPATIENT_CLINIC_OR_DEPARTMENT_OTHER): Payer: Medicare (Managed Care)

## 2013-05-25 ENCOUNTER — Other Ambulatory Visit (HOSPITAL_BASED_OUTPATIENT_CLINIC_OR_DEPARTMENT_OTHER): Payer: Medicare (Managed Care) | Admitting: Lab

## 2013-05-25 VITALS — BP 142/78 | HR 82 | Temp 98.0°F | Resp 18

## 2013-05-25 DIAGNOSIS — D693 Immune thrombocytopenic purpura: Secondary | ICD-10-CM

## 2013-05-25 DIAGNOSIS — Z5112 Encounter for antineoplastic immunotherapy: Secondary | ICD-10-CM

## 2013-05-25 LAB — CBC WITH DIFFERENTIAL (CANCER CENTER ONLY)
BASO#: 0 10*3/uL (ref 0.0–0.2)
EOS%: 4.3 % (ref 0.0–7.0)
Eosinophils Absolute: 0.2 10*3/uL (ref 0.0–0.5)
HCT: 41.1 % (ref 34.8–46.6)
HGB: 14.3 g/dL (ref 11.6–15.9)
LYMPH#: 1.8 10*3/uL (ref 0.9–3.3)
MCV: 92 fL (ref 81–101)
NEUT#: 2.7 10*3/uL (ref 1.5–6.5)
NEUT%: 53.2 % (ref 39.6–80.0)
RBC: 4.47 10*6/uL (ref 3.70–5.32)
WBC: 5.1 10*3/uL (ref 3.9–10.0)

## 2013-05-25 MED ORDER — ALTEPLASE 2 MG IJ SOLR
2.0000 mg | Freq: Once | INTRAMUSCULAR | Status: DC | PRN
  Filled 2013-05-25: qty 2

## 2013-05-25 MED ORDER — DIPHENHYDRAMINE HCL 25 MG PO CAPS
50.0000 mg | ORAL_CAPSULE | Freq: Once | ORAL | Status: AC
  Administered 2013-05-25: 50 mg via ORAL

## 2013-05-25 MED ORDER — HEPARIN SOD (PORK) LOCK FLUSH 100 UNIT/ML IV SOLN
250.0000 [IU] | Freq: Once | INTRAVENOUS | Status: DC | PRN
  Filled 2013-05-25: qty 5

## 2013-05-25 MED ORDER — LORAZEPAM 1 MG PO TABS
1.0000 mg | ORAL_TABLET | Freq: Once | ORAL | Status: AC
  Administered 2013-05-25: 1 mg via ORAL

## 2013-05-25 MED ORDER — DIPHENHYDRAMINE HCL 25 MG PO CAPS
ORAL_CAPSULE | ORAL | Status: AC
  Filled 2013-05-25: qty 2

## 2013-05-25 MED ORDER — HEPARIN SOD (PORK) LOCK FLUSH 100 UNIT/ML IV SOLN
500.0000 [IU] | Freq: Once | INTRAVENOUS | Status: DC | PRN
  Filled 2013-05-25: qty 5

## 2013-05-25 MED ORDER — SODIUM CHLORIDE 0.9 % IV SOLN
375.0000 mg/m2 | Freq: Once | INTRAVENOUS | Status: AC
  Administered 2013-05-25: 600 mg via INTRAVENOUS
  Filled 2013-05-25: qty 60

## 2013-05-25 MED ORDER — SODIUM CHLORIDE 0.9 % IJ SOLN
3.0000 mL | Freq: Once | INTRAMUSCULAR | Status: DC | PRN
  Filled 2013-05-25: qty 10

## 2013-05-25 MED ORDER — DEXAMETHASONE SODIUM PHOSPHATE 20 MG/5ML IJ SOLN
INTRAMUSCULAR | Status: AC
  Filled 2013-05-25: qty 5

## 2013-05-25 MED ORDER — DEXAMETHASONE SODIUM PHOSPHATE 20 MG/5ML IJ SOLN
20.0000 mg | Freq: Once | INTRAMUSCULAR | Status: AC
  Administered 2013-05-25: 20 mg via INTRAVENOUS

## 2013-05-25 MED ORDER — ACETAMINOPHEN 325 MG PO TABS
650.0000 mg | ORAL_TABLET | Freq: Once | ORAL | Status: AC
  Administered 2013-05-25: 650 mg via ORAL

## 2013-05-25 MED ORDER — SODIUM CHLORIDE 0.9 % IJ SOLN
10.0000 mL | INTRAMUSCULAR | Status: DC | PRN
  Filled 2013-05-25: qty 10

## 2013-05-25 MED ORDER — LORAZEPAM 1 MG PO TABS
ORAL_TABLET | ORAL | Status: AC
  Filled 2013-05-25: qty 1

## 2013-05-25 MED ORDER — ACETAMINOPHEN 325 MG PO TABS
ORAL_TABLET | ORAL | Status: AC
  Filled 2013-05-25: qty 2

## 2013-05-25 MED ORDER — SODIUM CHLORIDE 0.9 % IV SOLN
Freq: Once | INTRAVENOUS | Status: AC
  Administered 2013-05-25: 10:00:00 via INTRAVENOUS

## 2013-05-25 NOTE — Patient Instructions (Signed)

## 2013-05-28 ENCOUNTER — Other Ambulatory Visit: Payer: Medicare (Managed Care) | Admitting: Lab

## 2013-06-01 ENCOUNTER — Other Ambulatory Visit (HOSPITAL_BASED_OUTPATIENT_CLINIC_OR_DEPARTMENT_OTHER): Payer: Medicare (Managed Care) | Admitting: Lab

## 2013-06-01 ENCOUNTER — Ambulatory Visit (HOSPITAL_BASED_OUTPATIENT_CLINIC_OR_DEPARTMENT_OTHER): Payer: Medicare (Managed Care)

## 2013-06-01 VITALS — BP 114/74 | HR 67 | Temp 97.1°F | Resp 18

## 2013-06-01 DIAGNOSIS — D693 Immune thrombocytopenic purpura: Secondary | ICD-10-CM

## 2013-06-01 DIAGNOSIS — Z5112 Encounter for antineoplastic immunotherapy: Secondary | ICD-10-CM

## 2013-06-01 LAB — CBC WITH DIFFERENTIAL (CANCER CENTER ONLY)
BASO#: 0 10*3/uL (ref 0.0–0.2)
EOS%: 5.4 % (ref 0.0–7.0)
HCT: 41.3 % (ref 34.8–46.6)
HGB: 14.3 g/dL (ref 11.6–15.9)
LYMPH#: 1.8 10*3/uL (ref 0.9–3.3)
MCHC: 34.6 g/dL (ref 32.0–36.0)
MONO#: 0.3 10*3/uL (ref 0.1–0.9)
NEUT#: 1.5 10*3/uL (ref 1.5–6.5)
NEUT%: 38.7 % — ABNORMAL LOW (ref 39.6–80.0)
RBC: 4.52 10*6/uL (ref 3.70–5.32)
WBC: 3.9 10*3/uL (ref 3.9–10.0)

## 2013-06-01 MED ORDER — DEXAMETHASONE SODIUM PHOSPHATE 20 MG/5ML IJ SOLN
20.0000 mg | Freq: Once | INTRAMUSCULAR | Status: AC
  Administered 2013-06-01: 20 mg via INTRAVENOUS

## 2013-06-01 MED ORDER — SODIUM CHLORIDE 0.9 % IJ SOLN
3.0000 mL | INTRAMUSCULAR | Status: DC | PRN
  Filled 2013-06-01: qty 10

## 2013-06-01 MED ORDER — DEXAMETHASONE SODIUM PHOSPHATE 20 MG/5ML IJ SOLN
INTRAMUSCULAR | Status: AC
  Filled 2013-06-01: qty 5

## 2013-06-01 MED ORDER — ACETAMINOPHEN 325 MG PO TABS
ORAL_TABLET | ORAL | Status: AC
  Filled 2013-06-01: qty 2

## 2013-06-01 MED ORDER — SODIUM CHLORIDE 0.9 % IJ SOLN
10.0000 mL | INTRAMUSCULAR | Status: DC | PRN
  Filled 2013-06-01: qty 10

## 2013-06-01 MED ORDER — SODIUM CHLORIDE 0.9 % IV SOLN
375.0000 mg/m2 | Freq: Once | INTRAVENOUS | Status: AC
  Administered 2013-06-01: 600 mg via INTRAVENOUS
  Filled 2013-06-01: qty 60

## 2013-06-01 MED ORDER — HEPARIN SOD (PORK) LOCK FLUSH 100 UNIT/ML IV SOLN
500.0000 [IU] | Freq: Once | INTRAVENOUS | Status: DC | PRN
  Filled 2013-06-01: qty 5

## 2013-06-01 MED ORDER — DIPHENHYDRAMINE HCL 25 MG PO CAPS
50.0000 mg | ORAL_CAPSULE | Freq: Once | ORAL | Status: AC
  Administered 2013-06-01: 50 mg via ORAL

## 2013-06-01 MED ORDER — FAMOTIDINE IN NACL 20-0.9 MG/50ML-% IV SOLN
40.0000 mg | Freq: Two times a day (BID) | INTRAVENOUS | Status: DC
  Administered 2013-06-01: 40 mg via INTRAVENOUS

## 2013-06-01 MED ORDER — HEPARIN SOD (PORK) LOCK FLUSH 100 UNIT/ML IV SOLN
250.0000 [IU] | Freq: Once | INTRAVENOUS | Status: DC | PRN
  Filled 2013-06-01: qty 5

## 2013-06-01 MED ORDER — SODIUM CHLORIDE 0.9 % IV SOLN
Freq: Once | INTRAVENOUS | Status: AC
  Administered 2013-06-01: 09:00:00 via INTRAVENOUS

## 2013-06-01 MED ORDER — ALTEPLASE 2 MG IJ SOLR
2.0000 mg | Freq: Once | INTRAMUSCULAR | Status: DC | PRN
  Filled 2013-06-01: qty 2

## 2013-06-01 MED ORDER — ACETAMINOPHEN 325 MG PO TABS
650.0000 mg | ORAL_TABLET | Freq: Once | ORAL | Status: AC
  Administered 2013-06-01: 650 mg via ORAL

## 2013-06-01 MED ORDER — DIPHENHYDRAMINE HCL 25 MG PO CAPS
ORAL_CAPSULE | ORAL | Status: AC
  Filled 2013-06-01: qty 2

## 2013-06-01 MED ORDER — FAMOTIDINE IN NACL 20-0.9 MG/50ML-% IV SOLN
INTRAVENOUS | Status: AC
  Filled 2013-06-01: qty 100

## 2013-06-01 NOTE — Progress Notes (Signed)
Patient plt 86. Dr Myna Hidalgo aware and he is ok to treat today. Patient complaining of upper gastric pain. M.D aware . Given order of Pepcid 40 mg.

## 2013-06-01 NOTE — Patient Instructions (Signed)

## 2013-06-03 ENCOUNTER — Other Ambulatory Visit: Payer: Self-pay

## 2013-06-04 ENCOUNTER — Other Ambulatory Visit: Payer: Medicare (Managed Care) | Admitting: Lab

## 2013-06-08 ENCOUNTER — Other Ambulatory Visit (HOSPITAL_BASED_OUTPATIENT_CLINIC_OR_DEPARTMENT_OTHER): Payer: Medicare (Managed Care) | Admitting: Lab

## 2013-06-08 ENCOUNTER — Ambulatory Visit (HOSPITAL_BASED_OUTPATIENT_CLINIC_OR_DEPARTMENT_OTHER): Payer: Medicare (Managed Care) | Admitting: Hematology & Oncology

## 2013-06-08 ENCOUNTER — Ambulatory Visit (HOSPITAL_BASED_OUTPATIENT_CLINIC_OR_DEPARTMENT_OTHER): Payer: Medicare (Managed Care)

## 2013-06-08 VITALS — BP 118/68 | HR 68 | Temp 98.1°F | Resp 18

## 2013-06-08 VITALS — BP 123/65 | HR 69 | Temp 98.0°F | Resp 14 | Ht <= 58 in | Wt 147.0 lb

## 2013-06-08 DIAGNOSIS — D693 Immune thrombocytopenic purpura: Secondary | ICD-10-CM

## 2013-06-08 LAB — CBC WITH DIFFERENTIAL (CANCER CENTER ONLY)
EOS%: 4.6 % (ref 0.0–7.0)
Eosinophils Absolute: 0.3 10*3/uL (ref 0.0–0.5)
LYMPH#: 1.9 10*3/uL (ref 0.9–3.3)
MCH: 31.9 pg (ref 26.0–34.0)
MONO#: 0.6 10*3/uL (ref 0.1–0.9)
MONO%: 10 % (ref 0.0–13.0)
NEUT#: 3.3 10*3/uL (ref 1.5–6.5)
Platelets: 125 10*3/uL — ABNORMAL LOW (ref 145–400)
RBC: 4.58 10*6/uL (ref 3.70–5.32)
RDW: 12.5 % (ref 11.1–15.7)
WBC: 6.1 10*3/uL (ref 3.9–10.0)

## 2013-06-08 MED ORDER — DEXAMETHASONE SODIUM PHOSPHATE 20 MG/5ML IJ SOLN
INTRAMUSCULAR | Status: AC
  Filled 2013-06-08: qty 5

## 2013-06-08 MED ORDER — SODIUM CHLORIDE 0.9 % IV SOLN
Freq: Once | INTRAVENOUS | Status: AC
  Administered 2013-06-08: 12:00:00 via INTRAVENOUS

## 2013-06-08 MED ORDER — DIPHENHYDRAMINE HCL 25 MG PO CAPS
ORAL_CAPSULE | ORAL | Status: AC
  Filled 2013-06-08: qty 2

## 2013-06-08 MED ORDER — SODIUM CHLORIDE 0.9 % IV SOLN
375.0000 mg/m2 | Freq: Once | INTRAVENOUS | Status: AC
  Administered 2013-06-08: 600 mg via INTRAVENOUS
  Filled 2013-06-08: qty 60

## 2013-06-08 MED ORDER — ACETAMINOPHEN 325 MG PO TABS
ORAL_TABLET | ORAL | Status: AC
  Filled 2013-06-08: qty 2

## 2013-06-08 MED ORDER — DIPHENHYDRAMINE HCL 25 MG PO CAPS
50.0000 mg | ORAL_CAPSULE | Freq: Once | ORAL | Status: AC
  Administered 2013-06-08: 50 mg via ORAL

## 2013-06-08 MED ORDER — DEXAMETHASONE SODIUM PHOSPHATE 20 MG/5ML IJ SOLN
20.0000 mg | Freq: Once | INTRAMUSCULAR | Status: AC
  Administered 2013-06-08: 20 mg via INTRAVENOUS

## 2013-06-08 MED ORDER — ACETAMINOPHEN 325 MG PO TABS
650.0000 mg | ORAL_TABLET | Freq: Once | ORAL | Status: AC
  Administered 2013-06-08: 650 mg via ORAL

## 2013-06-08 NOTE — Progress Notes (Signed)
This office note has been dictated.

## 2013-06-11 ENCOUNTER — Ambulatory Visit: Payer: Medicare (Managed Care) | Admitting: Hematology & Oncology

## 2013-06-11 ENCOUNTER — Other Ambulatory Visit: Payer: Medicare (Managed Care) | Admitting: Lab

## 2013-06-11 NOTE — Progress Notes (Signed)
CC:   Merlene Laughter. [name], M.D.  DIAGNOSIS:  Relapsed chronic immune thrombocytopenia.  CURRENT THERAPY:  Patient to complete 4 weeks of Rituxan today.  INTERIM HISTORY:  Jamie Mejia comes in for followup.  She is doing well with Rituxan.  She has had a little bit of "swelling" after the treatments.  I do give a little Decadron with each treatment.  The patient is having some bowel issues.  I do not think the bowel issues are related to the Rituxan.  She has had no fever.  She has had no rashes.  There has been no cough or shortness of breath.  She has not noticed any bleeding or increased bruising.  PHYSICAL EXAMINATION:  General:  This is a well-developed, well- nourished African-American female, in no obvious distress.  Vital Signs: Temperature 98, pulse 69, respiratory rate 14, blood pressure 123/65, weight is 147 pounds.  Head and neck exam shows a normocephalic, atraumatic skull.  There are no ocular or oral lesions.  There are no palpable cervical or supraclavicular lymph nodes.  Lungs are clear bilaterally.  Cardiac:  Regular rate and rhythm with a normal S1, S2. There are no murmurs, rubs, or bruits.  Abdomen is soft.  She has good bowel sounds.  There is no fluid wave.  There is no guarding or rebound tenderness.  There is no palpable hepatosplenomegaly.  Extremities show no clubbing, cyanosis, or edema.  She has good range motion of her joints.  Muscle strength is symmetric in the upper and lower extremities.  Neurological:  No focal neurological deficits.  Skin Exam: No rashes, ecchymosis, or petechiae.  LABORATORY STUDIES:  White cell count 6.1, hemoglobin 14.6, hematocrit 42.6, platelet count 125.  MCV is 93.  IMPRESSION:  Jamie Mejia is a very charming 72 year old African American female with chronic relapsed immune thrombocytopenia.  She has been on IVIG.  She responded initially but then failed.  We now have her on Rituxan.  She does not want to consider a  splenectomy unless there are no other options.  Her platelet count seems to be holding pretty steady right now.  We will go ahead with her fourth week today.  I will plan to see her back in about 3-4 weeks.  Hopefully, we will see a maintained response with the Rituxan.    ______________________________ Josph Macho, M.D. PRE/MEDQ  D:  06/07/2013  T:  06/09/2013  Job:  [id number]

## 2013-06-14 ENCOUNTER — Encounter: Payer: Self-pay | Admitting: Hematology & Oncology

## 2013-06-17 ENCOUNTER — Other Ambulatory Visit: Payer: Self-pay

## 2013-06-17 MED ORDER — ISOSORBIDE MONONITRATE ER 60 MG PO TB24: 60.0000 mg | ORAL_TABLET | Freq: Every day | ORAL | Status: DC

## 2013-06-17 NOTE — Telephone Encounter (Signed)
Rx was sent to pharmacy electronically. 

## 2013-06-29 ENCOUNTER — Telehealth: Payer: Self-pay | Admitting: *Deleted

## 2013-06-29 ENCOUNTER — Encounter: Payer: Self-pay | Admitting: *Deleted

## 2013-06-29 NOTE — Telephone Encounter (Signed)
Pt called to ask when her next appt is.  Told her it was 07/07/13 at 0945.  Voiced understanding.

## 2013-07-04 ENCOUNTER — Other Ambulatory Visit: Payer: Self-pay | Admitting: Cardiovascular Disease

## 2013-07-05 NOTE — Telephone Encounter (Signed)
Rx was sent to pharmacy electronically. 

## 2013-07-07 ENCOUNTER — Telehealth: Payer: Self-pay | Admitting: Hematology & Oncology

## 2013-07-07 ENCOUNTER — Other Ambulatory Visit (HOSPITAL_BASED_OUTPATIENT_CLINIC_OR_DEPARTMENT_OTHER): Payer: Medicare (Managed Care) | Admitting: Lab

## 2013-07-07 ENCOUNTER — Ambulatory Visit (HOSPITAL_BASED_OUTPATIENT_CLINIC_OR_DEPARTMENT_OTHER): Payer: Medicare (Managed Care) | Admitting: Hematology & Oncology

## 2013-07-07 VITALS — BP 112/56 | HR 74 | Temp 98.1°F | Resp 14 | Ht 59.0 in | Wt 148.0 lb

## 2013-07-07 DIAGNOSIS — D693 Immune thrombocytopenic purpura: Secondary | ICD-10-CM

## 2013-07-07 LAB — CBC WITH DIFFERENTIAL (CANCER CENTER ONLY)
BASO#: 0 10e3/uL (ref 0.0–0.2)
BASO%: 0.4 % (ref 0.0–2.0)
EOS%: 3.9 % (ref 0.0–7.0)
Eosinophils Absolute: 0.2 10e3/uL (ref 0.0–0.5)
HCT: 38.7 % (ref 34.8–46.6)
HGB: 13.3 g/dL (ref 11.6–15.9)
LYMPH#: 1.7 10e3/uL (ref 0.9–3.3)
LYMPH%: 31.4 % (ref 14.0–48.0)
MCH: 31.9 pg (ref 26.0–34.0)
MCHC: 34.4 g/dL (ref 32.0–36.0)
MCV: 93 fL (ref 81–101)
MONO#: 0.6 10e3/uL (ref 0.1–0.9)
MONO%: 10.5 % (ref 0.0–13.0)
NEUT#: 2.9 10e3/uL (ref 1.5–6.5)
NEUT%: 53.8 % (ref 39.6–80.0)
Platelets: 163 10e3/uL (ref 145–400)
RBC: 4.17 10e6/uL (ref 3.70–5.32)
RDW: 12.3 % (ref 11.1–15.7)
WBC: 5.4 10e3/uL (ref 3.9–10.0)

## 2013-07-07 NOTE — Telephone Encounter (Signed)
Mailed January schedule °

## 2013-07-07 NOTE — Progress Notes (Signed)
This office note has been dictated.

## 2013-07-09 NOTE — Progress Notes (Signed)
CC:   Jamie Mejia. Jamie Mejia, M.D.  DIAGNOSIS:  Chronic immune thrombocytopenia -- relapsed.  CURRENT THERAPY:  The patient is status post 4-week cycle of Rituxan.  INTERIM HISTORY:  Jamie Mejia comes in for followup.  She got her fourth week of Rituxan back on November 11.  She tolerated the Rituxan quite well.  She has had a nice response to date.  She did have a fall at a grocery store last week.  Apparently, she did not break anything or have any bleeding.  She has had no problems with fever, sweats, or chills.  She has had no cough or shortness of breath.  There have been no rashes.  There has been no change in bowel or bladder habits.  PHYSICAL EXAMINATION:  General:  This is a well-developed, well- nourished, African American female, in no obvious distress.  Vital Signs:  Temperature of 98.1, pulse 74, respiratory rate 14, blood pressure 112/56.  Weight is 148 pounds.  Head and Neck:  Normocephalic, atraumatic skull.  There are no ocular or oral lesions.  There are no palpable cervical or supraclavicular lymph nodes.  Lungs:  Clear bilaterally.  Cardiac:  Regular rate and rhythm with a normal S1 and S2. There are no murmurs, rubs, or bruits.  Abdomen:  Soft.  She has good bowel sounds.  There is no fluid wave.  There is no palpable hepatosplenomegaly.  Extremities:  No clubbing, cyanosis, or edema. Neurologic:  No focal neurological deficits.  Skin:  No rashes, ecchymosis, or petechia.  LABORATORY STUDIES:  White cell count is 5.4, hemoglobin 13.3, hematocrit 38.7, platelet count 163.  IMPRESSION:  Jamie Mejia is a very nice 72 year old African American female with relapsed immune thrombocytopenia.  She received Rituxan. She completed this on June 08, 2013.  So far, she has responded.  Hopefully, we will see a response that will last.  We can hold off on doing any more weekly labs.  I will plan to see her back in another 6 weeks.  I told her she can always come  back if she thinks she needs to have lab work done.   ______________________________ Jamie Mejia, M.D. PRE/MEDQ  D:  07/07/2013  T:  07/08/2013  Job:  [id number]

## 2013-08-11 ENCOUNTER — Encounter: Payer: Self-pay | Admitting: Hematology & Oncology

## 2013-08-12 ENCOUNTER — Telehealth: Payer: Self-pay | Admitting: Hematology & Oncology

## 2013-08-12 NOTE — Telephone Encounter (Signed)
Pt moved 1-21 to 1-30

## 2013-08-18 ENCOUNTER — Ambulatory Visit: Payer: Medicare (Managed Care) | Admitting: Hematology & Oncology

## 2013-08-18 ENCOUNTER — Other Ambulatory Visit: Payer: Medicare (Managed Care) | Admitting: Lab

## 2013-08-27 ENCOUNTER — Other Ambulatory Visit (HOSPITAL_BASED_OUTPATIENT_CLINIC_OR_DEPARTMENT_OTHER): Payer: Medicare (Managed Care) | Admitting: Lab

## 2013-08-27 ENCOUNTER — Encounter: Payer: Self-pay | Admitting: Hematology & Oncology

## 2013-08-27 ENCOUNTER — Ambulatory Visit (HOSPITAL_BASED_OUTPATIENT_CLINIC_OR_DEPARTMENT_OTHER): Payer: Medicare (Managed Care) | Admitting: Hematology & Oncology

## 2013-08-27 VITALS — BP 135/74 | HR 72 | Temp 97.4°F | Resp 14 | Ht 62.0 in | Wt 151.0 lb

## 2013-08-27 DIAGNOSIS — D693 Immune thrombocytopenic purpura: Secondary | ICD-10-CM

## 2013-08-27 LAB — CBC WITH DIFFERENTIAL (CANCER CENTER ONLY)
BASO#: 0 10*3/uL (ref 0.0–0.2)
BASO%: 0.4 % (ref 0.0–2.0)
EOS%: 3.7 % (ref 0.0–7.0)
Eosinophils Absolute: 0.2 10*3/uL (ref 0.0–0.5)
HCT: 38.7 % (ref 34.8–46.6)
HGB: 12.9 g/dL (ref 11.6–15.9)
LYMPH#: 1.9 10*3/uL (ref 0.9–3.3)
LYMPH%: 34.3 % (ref 14.0–48.0)
MCH: 31.6 pg (ref 26.0–34.0)
MCHC: 33.3 g/dL (ref 32.0–36.0)
MCV: 95 fL (ref 81–101)
MONO#: 0.5 10*3/uL (ref 0.1–0.9)
MONO%: 9 % (ref 0.0–13.0)
NEUT%: 52.6 % (ref 39.6–80.0)
NEUTROS ABS: 2.9 10*3/uL (ref 1.5–6.5)
PLATELETS: 171 10*3/uL (ref 145–400)
RBC: 4.08 10*6/uL (ref 3.70–5.32)
RDW: 12 % (ref 11.1–15.7)
WBC: 5.4 10*3/uL (ref 3.9–10.0)

## 2013-08-27 LAB — CHCC SATELLITE - SMEAR

## 2013-08-27 NOTE — Progress Notes (Signed)
This office note has been dictated.

## 2013-08-28 NOTE — Progress Notes (Signed)
CC:   Merlene LaughterKimberly R. Renae GlossShelton, M.D.  DIAGNOSIS:  Chronic immune thrombocytopenia - relapsed.  CURRENT THERAPY:  Observation.  INTERIM HISTORY:  Ms. Renae GlossShelton comes in for followup.  She is doing really well.  She had no problems over the holidays.  When we last saw her, she had a little fall.  At that time, she denied bleed with this.  She has gained a little bit of weight.  She wants to try to exercise a bit more now that January has come and gone.  She has had no change in bowel or bladder habits.  She has had no leg swelling.  She has had no rashes.  She has had no headache.  PHYSICAL EXAMINATION:  General:  This is a well-developed, well- nourished [place] female, in no obvious distress.  Vital Signs: Temperature of 97.4, pulse 72, respiratory rate 14, blood pressure 135/74, weight is 151 pounds.  Head and Neck:  Normocephalic, atraumatic skull.  There are no ocular or oral lesions.  There are no palpable cervical or supraclavicular lymph nodes.  Lungs:  Clear bilaterally. Cardiac:  Regular rate and rhythm with a normal S1 and S2.  There are no murmurs, rubs, or bruits.  Abdomen:  Soft.  She has good bowel sounds. There is no fluid wave.  There is no palpable abdominal mass.  There is no palpable hepatosplenomegaly.  Back:  No tenderness over the spine, ribs, or hips.  Extremities:  No clubbing, cyanosis, or edema.  Skin: No rashes, ecchymosis, or petechiae.  Neurological:  No focal neurological deficits.  LABORATORY STUDIES:  White cell count 5.4, hemoglobin 12.9, hematocrit 38.7, platelet count 171.  On peripheral smear, she has good platelet maturation.  She has a couple of large platelets.  Platelets are well granulated.  White cells appear normal in morphology and maturation.  I see no immature myeloid or lymphoid forms.  Red cells are without schistocytes.  There is no rouleaux formation.  IMPRESSION:  Ms. Renae GlossShelton is a very charming 73 year old African  American female.  She has relapsed high immune thrombocytopenia.  She underwent 4 weeks of Rituxan.  She completed this in November.  Again, she has responded nicely.  She is still responding.  We will plan to get her back now in 2 months.  I reviewed her lab work with her.  I explained her how we are going to plan for a followup.    ______________________________ Josph MachoPeter R Ennever, M.D. PRE/MEDQ  D:  08/27/2013  T:  08/28/2013  Job:  [id number]

## 2013-10-22 ENCOUNTER — Ambulatory Visit (HOSPITAL_BASED_OUTPATIENT_CLINIC_OR_DEPARTMENT_OTHER)
Admission: RE | Admit: 2013-10-22 | Discharge: 2013-10-22 | Disposition: A | Discharge status: Home / Self Care | Payer: Medicare Other | Source: Ambulatory Visit | Attending: Hematology & Oncology | Admitting: Hematology & Oncology

## 2013-10-22 ENCOUNTER — Ambulatory Visit (HOSPITAL_BASED_OUTPATIENT_CLINIC_OR_DEPARTMENT_OTHER): Payer: Medicare Other | Admitting: Hematology & Oncology

## 2013-10-22 ENCOUNTER — Other Ambulatory Visit (HOSPITAL_BASED_OUTPATIENT_CLINIC_OR_DEPARTMENT_OTHER): Payer: Medicare Other | Admitting: Lab

## 2013-10-22 ENCOUNTER — Ambulatory Visit: Payer: Medicare (Managed Care) | Admitting: Hematology & Oncology

## 2013-10-22 ENCOUNTER — Other Ambulatory Visit: Payer: Medicare (Managed Care) | Admitting: Lab

## 2013-10-22 VITALS — BP 141/92 | HR 77 | Temp 97.6°F | Resp 18 | Wt 141.0 lb

## 2013-10-22 DIAGNOSIS — D693 Immune thrombocytopenic purpura: Secondary | ICD-10-CM

## 2013-10-22 DIAGNOSIS — M25551 Pain in right hip: Secondary | ICD-10-CM

## 2013-10-22 DIAGNOSIS — M25559 Pain in unspecified hip: Secondary | ICD-10-CM | POA: Insufficient documentation

## 2013-10-22 LAB — CBC WITH DIFFERENTIAL (CANCER CENTER ONLY)
BASO#: 0 10*3/uL (ref 0.0–0.2)
BASO%: 0.3 % (ref 0.0–2.0)
EOS%: 1.9 % (ref 0.0–7.0)
Eosinophils Absolute: 0.1 10*3/uL (ref 0.0–0.5)
HEMATOCRIT: 40.3 % (ref 34.8–46.6)
HEMOGLOBIN: 13.8 g/dL (ref 11.6–15.9)
LYMPH#: 1.7 10*3/uL (ref 0.9–3.3)
LYMPH%: 29.1 % (ref 14.0–48.0)
MCH: 31.5 pg (ref 26.0–34.0)
MCHC: 34.2 g/dL (ref 32.0–36.0)
MCV: 92 fL (ref 81–101)
MONO#: 0.5 10*3/uL (ref 0.1–0.9)
MONO%: 9.4 % (ref 0.0–13.0)
NEUT#: 3.4 10*3/uL (ref 1.5–6.5)
NEUT%: 59.3 % (ref 39.6–80.0)
Platelets: 206 10*3/uL (ref 145–400)
RBC: 4.38 10*6/uL (ref 3.70–5.32)
RDW: 11.8 % (ref 11.1–15.7)
WBC: 5.7 10*3/uL (ref 3.9–10.0)

## 2013-10-22 LAB — CHCC SATELLITE - SMEAR

## 2013-10-22 NOTE — Progress Notes (Signed)
Hematology and Oncology Follow Up Visit  Jamie MannersChristine C Mejia [id number] [birth year] 73 y.o. 10/22/2013   Principle Diagnosis:   Chronic immune thrombocytopenia  Current Therapy:    Observation     Interim History:  Ms.  Jamie Mejia is back for followup. Last him back in January. Unfortunately, she is having problems with her right hip. I think she may have fallen during the wintertime. We did get an x-ray of the right hip. This did not show anything.  We will see about getting her to an orthopedic surgeon.  Next problem is that her husband is in the hospital. He has relapsed myeloma. He may have had some problem with treatment.  She's had no bleeding. She's had no bruising. This is very hard for her to get around because of the right hip pain.  Medications: Current outpatient prescriptions:ALPRAZolam (XANAX) 1 MG tablet, Take 1 mg by mouth at bedtime as needed for sleep. , Disp: , Rfl: ;  buPROPion (WELLBUTRIN XL) 150 MG 24 hr tablet, Take 150 mg by mouth daily., Disp: , Rfl: ;  Cholecalciferol (VITAMIN D3) 2000 UNITS TABS, Take 2,000 Units by mouth daily., Disp: , Rfl: ;  clopidogrel (PLAVIX) 75 MG tablet, Take 75 mg by mouth daily., Disp: , Rfl:  Cyanocobalamin (VITAMIN B 12 PO), Take by mouth every morning., Disp: , Rfl: ;  isosorbide mononitrate (IMDUR) 60 MG 24 hr tablet, TAKE 1 TABLET EVERY DAY, Disp: 30 tablet, Rfl: 10;  loratadine (CLARITIN) 10 MG tablet, Take 10 mg by mouth daily as needed. , Disp: , Rfl: ;  metoprolol succinate (TOPROL-XL) 25 MG 24 hr tablet, Take 25 mg by mouth daily., Disp: , Rfl: ;  Probiotic Product (ALIGN) 4 MG CAPS, Take 4 mg by mouth daily., Disp: , Rfl:  simvastatin (ZOCOR) 20 MG tablet, Take 20 mg by mouth daily., Disp: , Rfl:   Allergies:  Allergies  Allergen Reactions  . Latex Itching  . Peanut Butter Flavor Hives    Past Medical History, Surgical history, Social history, and Family History were reviewed and updated.  Review of Systems: As  above  Physical Exam:  weight is 141 lb (63.957 kg). Her oral temperature is 97.6 F (36.4 C). Her blood pressure is 141/92 and her pulse is 77. Her respiration is 18.   Petit Afro-American female. Skin exam shows no ecchymosis or petechia. Lungs are clear. Cardiac exam regular rate and rhythm. Abdomen is soft. There is no palpable liver or spleen tip. Back exam does show some tenderness over the posterior right hip. No swelling is noted. I do not see rash. Head and neck exam shows no scleral icterus. Extremities shows decent strength in her legs.  Lab Results  Component Value Date   WBC 5.7 10/22/2013   HGB 13.8 10/22/2013   HCT 40.3 10/22/2013   MCV 92 10/22/2013   PLT 206 10/22/2013     Chemistry      Component Value Date/Time   NA 138 11/22/2009 1458   K 4.3 11/22/2009 1458   CL 106 11/22/2009 1458   CO2 24 11/22/2009 1458   BUN 14 11/22/2009 1458   CREATININE 1.16 11/22/2009 1458      Component Value Date/Time   CALCIUM 9.1 11/22/2009 1458   ALKPHOS 48 11/22/2009 1458   AST 27 11/22/2009 1458   ALT 25 11/22/2009 1458   BILITOT 0.5 11/22/2009 1458         Impression and Plan: Ms. Jamie Mejia is 73 year old African American female. She has relapsed  immune thrombocytopenia. We treated her with Rituxan. She completed this in November of 2014.  She still responding. Her platelet count continues to improve.  I spent over a half hour with her. I tried to help her out with the hip issues. Again we will make a referral to orthopedic surgery for this.  If, for some reason, she needs surgery there should be no problems with her blood.  I looked at her blood smear and everything looked fantastic. She a good platelet function and appearance. No immature myeloid lymphoid cells were noted.  I want to see her back in 3 months.  Josph Macho, MD 3/27/20151:37 PM

## 2013-10-27 ENCOUNTER — Encounter (HOSPITAL_COMMUNITY): Payer: Self-pay | Admitting: Emergency Medicine

## 2013-10-27 ENCOUNTER — Emergency Department (HOSPITAL_COMMUNITY)
Admission: EM | Admit: 2013-10-27 | Discharge: 2013-10-27 | Disposition: A | Discharge status: Home / Self Care | Payer: Medicare Other | Attending: Emergency Medicine | Admitting: Emergency Medicine

## 2013-10-27 ENCOUNTER — Emergency Department (HOSPITAL_COMMUNITY): Payer: Medicare Other

## 2013-10-27 ENCOUNTER — Emergency Department (HOSPITAL_COMMUNITY)
Admission: EM | Admit: 2013-10-27 | Discharge: 2013-10-27 | Disposition: A | Discharge status: Nursing Home (Includes ICF) | Payer: Medicare Other | Source: Home / Self Care

## 2013-10-27 DIAGNOSIS — D693 Immune thrombocytopenic purpura: Secondary | ICD-10-CM | POA: Insufficient documentation

## 2013-10-27 DIAGNOSIS — R0789 Other chest pain: Secondary | ICD-10-CM | POA: Insufficient documentation

## 2013-10-27 DIAGNOSIS — Z91040 Latex allergy status: Secondary | ICD-10-CM | POA: Insufficient documentation

## 2013-10-27 DIAGNOSIS — E785 Hyperlipidemia, unspecified: Secondary | ICD-10-CM | POA: Insufficient documentation

## 2013-10-27 DIAGNOSIS — Z87891 Personal history of nicotine dependence: Secondary | ICD-10-CM | POA: Insufficient documentation

## 2013-10-27 DIAGNOSIS — Z79899 Other long term (current) drug therapy: Secondary | ICD-10-CM | POA: Insufficient documentation

## 2013-10-27 DIAGNOSIS — R079 Chest pain, unspecified: Secondary | ICD-10-CM

## 2013-10-27 DIAGNOSIS — I10 Essential (primary) hypertension: Secondary | ICD-10-CM | POA: Insufficient documentation

## 2013-10-27 DIAGNOSIS — Z9861 Coronary angioplasty status: Secondary | ICD-10-CM | POA: Insufficient documentation

## 2013-10-27 LAB — I-STAT TROPONIN, ED
Troponin i, poc: 0 ng/mL (ref 0.00–0.08)
Troponin i, poc: 0 ng/mL (ref 0.00–0.08)

## 2013-10-27 LAB — COMPREHENSIVE METABOLIC PANEL
ALT: 16 U/L (ref 0–35)
AST: 20 U/L (ref 0–37)
Albumin: 3.9 g/dL (ref 3.5–5.2)
Alkaline Phosphatase: 104 U/L (ref 39–117)
BILIRUBIN TOTAL: 0.4 mg/dL (ref 0.3–1.2)
BUN: 10 mg/dL (ref 6–23)
CHLORIDE: 106 meq/L (ref 96–112)
CO2: 25 mEq/L (ref 19–32)
Calcium: 9.4 mg/dL (ref 8.4–10.5)
Creatinine, Ser: 1.2 mg/dL — ABNORMAL HIGH (ref 0.50–1.10)
GFR, EST AFRICAN AMERICAN: 51 mL/min — AB (ref >90)
GFR, EST NON AFRICAN AMERICAN: 44 mL/min — AB (ref >90)
GLUCOSE: 120 mg/dL — AB (ref 70–99)
Potassium: 4.2 mEq/L (ref 3.7–5.3)
Sodium: 144 mEq/L (ref 137–147)
Total Protein: 7.2 g/dL (ref 6.0–8.3)

## 2013-10-27 LAB — CBC
HEMATOCRIT: 40.2 % (ref 36.0–46.0)
HEMOGLOBIN: 14.2 g/dL (ref 12.0–15.0)
MCH: 32.3 pg (ref 26.0–34.0)
MCHC: 35.3 g/dL (ref 30.0–36.0)
MCV: 91.6 fL (ref 78.0–100.0)
Platelets: 201 10*3/uL (ref 150–400)
RBC: 4.39 MIL/uL (ref 3.87–5.11)
RDW: 12 % (ref 11.5–15.5)
WBC: 6 10*3/uL (ref 4.0–10.5)

## 2013-10-27 MED ORDER — ASPIRIN 325 MG PO TABS
325.0000 mg | ORAL_TABLET | Freq: Once | ORAL | Status: AC
  Administered 2013-10-27: 325 mg via ORAL

## 2013-10-27 MED ORDER — ASPIRIN 81 MG PO CHEW
CHEWABLE_TABLET | ORAL | Status: AC
  Filled 2013-10-27: qty 4

## 2013-10-27 NOTE — ED Notes (Addendum)
Pt reports to the ED via [facility] for eval of sharp shooting intermittent left sided CP. Pt reports the pain is 10/10 when it occurs but has been pain free since 1545. She received 324 of ASA PTA. No nitro was given. Pt denies any associated symptoms such as SOB, N/V, diaphoresis, abd pain, dizziness, or lightheadedness. Per pt she has been under increased stress sicne her husband was released from the hospital. Pt NSR. She is A&Ox4, resp e/u, and skin warm and dry.

## 2013-10-27 NOTE — ED Provider Notes (Signed)
CSN: [id number]     Arrival date & time 10/27/13  1508 History   None    Chief Complaint  Patient presents with  . Chest Pain   (Consider location/radiation/quality/duration/timing/severity/associated sxs/prior Treatment) HPI Comments: Pt began having intermittent L chest pain about an hour ago, frequency and severity of pain is increasing. Reports being under increased stress as husband was discharged home from hospital today.   Patient is a 73 y.o. female presenting with chest pain. The history is provided by the patient.  Chest Pain Pain location:  L chest Pain quality: shooting   Radiates to: L lateral chest/side/back. Pain radiates to the back: yes   Pain severity:  Moderate Onset quality:  Sudden Duration:  1 hour Timing:  Intermittent Progression:  Worsening Chronicity:  New Context: stress   Relieved by:  None tried Worsened by:  Nothing tried Ineffective treatments:  None tried Associated symptoms: no abdominal pain, no diaphoresis, no dizziness, no fever, no nausea, no palpitations and no shortness of breath   Risk factors: coronary artery disease and hypertension     Past Medical History  Diagnosis Date  . 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 Hypertension   . Hyperlipemia    Past Surgical History  Procedure Laterality Date  . Coronary artery stenting      stenting to mid right coronary artery at which time was noted  to have concomitant coronary artery disease with 60% to 70% distal narrowing, 50%to 70% circumflex OM2 stenosis, and 50% stenosis in a twin-like LAD segment   Family History  Problem Relation Age of Onset  . Heart disease Mother   . Heart disease Brother    History  Substance Use Topics  . Smoking status: Former Smoker -- 0.75 packs/day for 43 years    Types: Cigarettes    Start date: 08/27/1958    Quit date: 04/28/2002  . Smokeless tobacco: Never Used     Comment: quit 14 years ago  . Alcohol Use: No   OB History   Grav  Para Term Preterm Abortions TAB SAB Ect Mult Living                 Review of Systems  Constitutional: Negative for fever and diaphoresis.  Respiratory: Negative for shortness of breath.   Cardiovascular: Positive for chest pain. Negative for palpitations.  Gastrointestinal: Negative for nausea and abdominal pain.  Neurological: Negative for dizziness.    Allergies  Latex and Peanut butter flavor  Home Medications   Current Outpatient Rx  Name  Route  Sig  Dispense  Refill  . ALPRAZolam (XANAX) 1 MG tablet   Oral   Take 1 mg by mouth at bedtime as needed for sleep.          Marland Kitchen. buPROPion (WELLBUTRIN XL) 150 MG 24 hr tablet   Oral   Take 150 mg by mouth daily.         . Cholecalciferol (VITAMIN D3) 2000 UNITS TABS   Oral   Take 2,000 Units by mouth daily.         . clopidogrel (PLAVIX) 75 MG tablet   Oral   Take 75 mg by mouth daily.         . Cyanocobalamin (VITAMIN B 12 PO)   Oral   Take by mouth every morning.         . isosorbide mononitrate (IMDUR) 60 MG 24 hr tablet      TAKE 1 TABLET EVERY DAY   30  tablet   10   . loratadine (CLARITIN) 10 MG tablet   Oral   Take 10 mg by mouth daily as needed.          . metoprolol succinate (TOPROL-XL) 25 MG 24 hr tablet   Oral   Take 25 mg by mouth daily.         . Probiotic Product (ALIGN) 4 MG CAPS   Oral   Take 4 mg by mouth daily.         . simvastatin (ZOCOR) 20 MG tablet   Oral   Take 20 mg by mouth daily.          BP 191/101  Pulse 76  Temp(Src) 97.8 F (36.6 C) (Oral)  Resp 17  SpO2 94% Physical Exam  Constitutional: She appears well-developed and well-nourished.  Appears anxious  Cardiovascular: Normal rate and regular rhythm.   Pulmonary/Chest: Effort normal and breath sounds normal.  Skin: Skin is warm and dry.    ED Course  Procedures (including critical care time) Labs Review Labs Reviewed - No data to display Imaging Review No results found.   MDM   1. Chest  pain   Pt with CAD, stent with L chest pain for last hour. EKG abnormal (t wave abnormality), so transfer to ER for further eval.    Cathlyn Parsons, NP 10/27/13 1545

## 2013-10-27 NOTE — ED Notes (Signed)
Iv  Ns  tko  20  Angio  r  Hand  1  Att             Placed  On cardiac  Monitor           Nasal o2  At  2  l  /  Min

## 2013-10-27 NOTE — ED Notes (Signed)
Patients family came out of patients room stating she was having chest pain. Upon my arrival patient stated the chest pain that was described as sharp pain that did not increase with inspiration was gone. Patient states the pain only last for a few seconds each time.

## 2013-10-27 NOTE — ED Provider Notes (Signed)
CSN: [id number]     Arrival date & time 10/27/13  1619 History   First MD Initiated Contact with Patient 10/27/13 1709     No chief complaint on file.    (Consider location/radiation/quality/duration/timing/severity/associated sxs/prior Treatment) The history is provided by the patient.  Jamie Mejia is a 73 y.o. female hx of CAD s/p stent, HTN here with chest pain. Her husband came home from the hospital yesterday and she has been under a lot of stress. She was in the kitchen this afternoon and suddenly had left-sided chest pain lasting for several seconds. She had intermittent pain since then. Denies any shortness of breath. She had one cardiac stents in the past but current symptoms are not similar to that. She states that she has a lot of stress recently. Came here from urgent care for evaluation.    Past Medical History  Diagnosis Date  . 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 Hypertension   . Hyperlipemia    Past Surgical History  Procedure Laterality Date  . Coronary artery stenting      stenting to mid right coronary artery at which time was noted  to have concomitant coronary artery disease with 60% to 70% distal narrowing, 50%to 70% circumflex OM2 stenosis, and 50% stenosis in a twin-like LAD segment   Family History  Problem Relation Age of Onset  . Heart disease Mother   . Heart disease Brother    History  Substance Use Topics  . Smoking status: Former Smoker -- 0.75 packs/day for 43 years    Types: Cigarettes    Start date: 08/27/1958    Quit date: 04/28/2002  . Smokeless tobacco: Never Used     Comment: quit 14 years ago  . Alcohol Use: No   OB History   Grav Para Term Preterm Abortions TAB SAB Ect Mult Living                 Review of Systems  Cardiovascular: Positive for chest pain.  All other systems reviewed and are negative.      Allergies  Latex and Peanut butter flavor  Home Medications   Current Outpatient Rx  Name   Route  Sig  Dispense  Refill  . acetaminophen (TYLENOL) 500 MG tablet   Oral   Take 1,000 mg by mouth every 6 (six) hours as needed for moderate pain.         Marland Kitchen ALPRAZolam (XANAX) 1 MG tablet   Oral   Take 0.5 mg by mouth at bedtime as needed for sleep.          Marland Kitchen buPROPion (WELLBUTRIN XL) 150 MG 24 hr tablet   Oral   Take 150 mg by mouth daily.         . Cholecalciferol (VITAMIN D3) 2000 UNITS TABS   Oral   Take 2,000 Units by mouth daily.         . clopidogrel (PLAVIX) 75 MG tablet   Oral   Take 75 mg by mouth daily.         . Cyanocobalamin (VITAMIN B 12 PO)   Oral   Take 1 tablet by mouth every morning.          . isosorbide mononitrate (IMDUR) 60 MG 24 hr tablet   Oral   Take 60 mg by mouth daily.         Marland Kitchen loratadine (CLARITIN) 10 MG tablet   Oral   Take 10 mg by mouth daily as needed  for allergies.          . metoprolol succinate (TOPROL-XL) 25 MG 24 hr tablet   Oral   Take 25 mg by mouth daily.         . Probiotic Product (ALIGN) 4 MG CAPS   Oral   Take 4 mg by mouth daily.         . simvastatin (ZOCOR) 20 MG tablet   Oral   Take 20 mg by mouth every morning.           BP 146/78  Pulse 58  Temp(Src) 97.8 F (36.6 C) (Oral)  Resp 19  SpO2 97% Physical Exam  Nursing note and vitals reviewed. Constitutional: She is oriented to person, place, and time. She appears well-developed and well-nourished.  Slightly anxious   HENT:  Head: Normocephalic.  Mouth/Throat: Oropharynx is clear and moist.  Eyes: Conjunctivae are normal. Pupils are equal, round, and reactive to light.  Neck: Normal range of motion. Neck supple.  Cardiovascular: Normal rate, regular rhythm and normal heart sounds.   Pulmonary/Chest: Effort normal and breath sounds normal. No respiratory distress. She has no wheezes. She has no rales.  Abdominal: Soft. Bowel sounds are normal. She exhibits no distension. There is no tenderness. There is no rebound and no  guarding.  Musculoskeletal: Normal range of motion. She exhibits no edema and no tenderness.  Neurological: She is oriented to person, place, and time. No cranial nerve deficit. Coordination normal.  Skin: Skin is warm and dry.  Psychiatric: She has a normal mood and affect. Her behavior is normal. Judgment and thought content normal.    ED Course  Procedures (including critical care time) Labs Review Labs Reviewed  COMPREHENSIVE METABOLIC PANEL - Abnormal; Notable for the following:    Glucose, Bld 120 (*)    Creatinine, Ser 1.20 (*)    GFR calc non Af Amer 44 (*)    GFR calc Af Amer 51 (*)    All other components within normal limits  CBC  I-STAT TROPOININ, ED  Rosezena SensorI-STAT TROPOININ, ED   Imaging Review Dg Chest 2 View  10/27/2013   CLINICAL DATA:  Left chest pain  EXAM: CHEST  2 VIEW  COMPARISON:  04/08/2012  FINDINGS: Stable cardiomegaly. Negative for CHF or focal pneumonia. No collapse, consolidation, effusion or pneumothorax. Trachea midline. Atherosclerosis of the aorta. Minor mid thoracic degenerative change. Prominent right epicardial fat shadow.  IMPRESSION: Cardiomegaly without CHF.  Stable exam without acute process.   Electronically Signed   By: Ruel Favorsrevor  Shick M.D.   On: 10/27/2013 17:16     EKG Interpretation   Date/Time:  Wednesday October 27 2013 16:33:22 EDT Ventricular Rate:  58 PR Interval:  159 QRS Duration: 76 QT Interval:  410 QTC Calculation: 403 R Axis:   11 Text Interpretation:  Sinus rhythm Borderline T abnormalities, anterior  leads Baseline wander in lead(s) III No significant change since last  tracing Confirmed by YAO  MD, DAVID ([id number]) on 10/27/2013 4:40:54 PM      MDM   Final diagnoses:  None   Jamie Mejia is a 73 y.o. female here with chest pain. She is under a lot of stress. Atypical for ACS. Trop neg x 2, no further pain. Will d/c home. She has cardiology f/u and I encourage another stress test.    Richardean Canalavid H Yao, MD 10/27/13 2041

## 2013-10-27 NOTE — ED Notes (Signed)
Patient given graham crackers and applesauce

## 2013-10-27 NOTE — Discharge Instructions (Signed)
Take your medicines as prescribed.   See your cardiologist. You may need a repeat stress test.   Return to ER if you have severe chest pain, shortness of breath.

## 2013-10-27 NOTE — ED Notes (Signed)
Patient transported to X-ray 

## 2013-10-27 NOTE — ED Notes (Signed)
Pt  Reports  Symptoms  Of  Chest  Pain  For  About  1  Hr     Which  She   Describes  As  A  Spasm     she  Is  Awake  And  Alert and  Oriented  Her  Skin ios  Warm  And  Dry     She  Has  A  History of  Cardiac  Stint  In the past

## 2013-10-28 NOTE — ED Provider Notes (Signed)
Medical screening examination/treatment/procedure(s) were performed by a resident physician or non-physician practitioner and as the supervising physician I was immediately available for consultation/collaboration.  Brookelyn Gaynor, MD    Karianne Nogueira S Lidia Clavijo, MD 10/28/13 1643 

## 2014-01-21 ENCOUNTER — Ambulatory Visit (HOSPITAL_BASED_OUTPATIENT_CLINIC_OR_DEPARTMENT_OTHER): Payer: Medicare Other | Admitting: Hematology & Oncology

## 2014-01-21 ENCOUNTER — Other Ambulatory Visit (HOSPITAL_BASED_OUTPATIENT_CLINIC_OR_DEPARTMENT_OTHER): Payer: Medicare Other | Admitting: Lab

## 2014-01-21 VITALS — BP 118/64 | HR 69 | Temp 98.9°F | Resp 16 | Wt 141.0 lb

## 2014-01-21 DIAGNOSIS — D693 Immune thrombocytopenic purpura: Secondary | ICD-10-CM

## 2014-01-21 DIAGNOSIS — M25551 Pain in right hip: Secondary | ICD-10-CM

## 2014-01-21 LAB — CBC WITH DIFFERENTIAL (CANCER CENTER ONLY)
BASO#: 0 10*3/uL (ref 0.0–0.2)
BASO%: 0.2 % (ref 0.0–2.0)
EOS%: 2.1 % (ref 0.0–7.0)
Eosinophils Absolute: 0.1 10*3/uL (ref 0.0–0.5)
HCT: 42 % (ref 34.8–46.6)
HGB: 14.5 g/dL (ref 11.6–15.9)
LYMPH#: 1.9 10*3/uL (ref 0.9–3.3)
LYMPH%: 34.1 % (ref 14.0–48.0)
MCH: 32 pg (ref 26.0–34.0)
MCHC: 34.5 g/dL (ref 32.0–36.0)
MCV: 93 fL (ref 81–101)
MONO#: 0.5 10*3/uL (ref 0.1–0.9)
MONO%: 8 % (ref 0.0–13.0)
NEUT#: 3.2 10*3/uL (ref 1.5–6.5)
NEUT%: 55.6 % (ref 39.6–80.0)
PLATELETS: 223 10*3/uL (ref 145–400)
RBC: 4.53 10*6/uL (ref 3.70–5.32)
RDW: 12.1 % (ref 11.1–15.7)
WBC: 5.7 10*3/uL (ref 3.9–10.0)

## 2014-01-21 LAB — CHCC SATELLITE - SMEAR

## 2014-01-21 NOTE — Progress Notes (Signed)
Hematology and Oncology Follow Up Visit  Jamie MannersChristine C Mejia [id number] October 29, 1940 73 y.o. 01/21/2014   Principle Diagnosis:   Chronic immune thrombocytopenia  Current Therapy:    Observation     Interim History:  Ms.  Jamie Mejia is back for follow [facility] back in June. She's been doing okay. She's had a lot of problems with spinal stenosis. She refuses to have surgery for this.  Her husband is being treated for relapsed myeloma. He is doing okay right now. He was hospitalized but now is much better.. She has had no bleeding. His been no bruising.  He's been no change in bowel or bladder habits. She's had no cough. She's had no fever. There've been no rashes.  Medications: Current outpatient prescriptions:acetaminophen (TYLENOL) 500 MG tablet, Take 1,000 mg by mouth every 6 (six) hours as needed for moderate pain., Disp: , Rfl: ;  ALPRAZolam (XANAX) 0.25 MG tablet, Take by mouth., Disp: , Rfl: ;  ALPRAZolam (XANAX) 1 MG tablet, Take 0.5 mg by mouth at bedtime as needed for sleep. , Disp: , Rfl: ;  aspirin 81 MG chewable tablet, Chew by mouth., Disp: , Rfl:  buPROPion (WELLBUTRIN XL) 150 MG 24 hr tablet, Take 150 mg by mouth daily., Disp: , Rfl: ;  buPROPion (WELLBUTRIN) 75 MG tablet, Take by mouth., Disp: , Rfl: ;  calcium carbonate (TUMS EX) 750 MG chewable tablet, Chew by mouth., Disp: , Rfl: ;  Cholecalciferol (VITAMIN D3) 2000 UNITS TABS, Take 2,000 Units by mouth daily., Disp: , Rfl: ;  clopidogrel (PLAVIX) 75 MG tablet, Take 75 mg by mouth daily., Disp: , Rfl:  Cyanocobalamin (VITAMIN B 12 PO), Take 1 tablet by mouth every morning. , Disp: , Rfl: ;  isosorbide mononitrate (IMDUR) 60 MG 24 hr tablet, Take 60 mg by mouth daily., Disp: , Rfl: ;  loratadine (CLARITIN) 10 MG tablet, Take 10 mg by mouth daily as needed for allergies. , Disp: , Rfl: ;  metoprolol succinate (TOPROL-XL) 25 MG 24 hr tablet, Take 25 mg by mouth daily., Disp: , Rfl:  metoprolol succinate (TOPROL-XL) 25 MG 24 hr tablet, Take  by mouth., Disp: , Rfl: ;  Probiotic Product (ALIGN) 4 MG CAPS, Take 4 mg by mouth daily., Disp: , Rfl: ;  simvastatin (ZOCOR) 10 MG tablet, Take by mouth., Disp: , Rfl: ;  simvastatin (ZOCOR) 20 MG tablet, Take 20 mg by mouth every morning. , Disp: , Rfl:   Allergies:  Allergies  Allergen Reactions  . Latex Itching  . Peanut Butter Flavor Hives    Past Medical History, Surgical history, Social history, and Family History were reviewed and updated.  Review of Systems: As above  Physical Exam:  weight is 141 lb (63.957 kg). Her oral temperature is 98.9 F (37.2 C). Her blood pressure is 118/64 and her pulse is 69. Her respiration is 16.   Lungs are clear. Cardiac exam regular in rhythm. Abdomen soft. No palpable liver or spleen. Skin exam no ecchymoses or petechia. Extremities shows no clubbing cyanosis or edema. Neurological exam is non-focal. Exam no tenderness over the spine. Lymph nodes are nonpalpable.  Lab Results  Component Value Date   WBC 5.7 01/21/2014   HGB 14.5 01/21/2014   HCT 42.0 01/21/2014   MCV 93 01/21/2014   PLT 223 01/21/2014     Chemistry      Component Value Date/Time   NA 144 10/27/2013 1647   K 4.2 10/27/2013 1647   CL 106 10/27/2013 1647   CO2  25 10/27/2013 1647   BUN 10 10/27/2013 1647   CREATININE 1.20* 10/27/2013 1647      Component Value Date/Time   CALCIUM 9.4 10/27/2013 1647   ALKPHOS 104 10/27/2013 1647   AST 20 10/27/2013 1647   ALT 16 10/27/2013 1647   BILITOT 0.4 10/27/2013 1647         Impression and Plan: Ms. Jamie Mejia is 73 year old African female with chronic immune thrombocytopenia. We are aware head and treat her with Rituxan. She this in November of 2014.  She's doing great. She's had no relapse so far.  We will go and plan to get her back in 6 months now. I think this would be appropriate.   Josph MachoENNEVER,PETER R, MD 6/26/201511:22 AM

## 2014-05-24 ENCOUNTER — Ambulatory Visit: Payer: Medicare Other | Admitting: Cardiovascular Disease

## 2014-06-16 ENCOUNTER — Other Ambulatory Visit: Payer: Self-pay | Admitting: Cardiovascular Disease

## 2014-06-16 NOTE — Telephone Encounter (Signed)
Rx has been sent to the pharmacy electronically. ° °

## 2014-06-18 ENCOUNTER — Other Ambulatory Visit: Payer: Self-pay | Admitting: Cardiovascular Disease

## 2014-06-20 NOTE — Telephone Encounter (Signed)
Rx was sent to pharmacy electronically. 

## 2014-06-25 ENCOUNTER — Other Ambulatory Visit: Payer: Self-pay | Admitting: Cardiovascular Disease

## 2014-06-27 NOTE — Telephone Encounter (Signed)
Imdur refilled #30 tablets with 0 refills 06/20/2014

## 2014-06-30 ENCOUNTER — Encounter: Payer: Self-pay | Admitting: Cardiovascular Disease

## 2014-06-30 ENCOUNTER — Ambulatory Visit (INDEPENDENT_AMBULATORY_CARE_PROVIDER_SITE_OTHER): Payer: Medicare Other | Admitting: Cardiovascular Disease

## 2014-06-30 VITALS — BP 120/82 | HR 73 | Ht 59.0 in | Wt 144.1 lb

## 2014-06-30 DIAGNOSIS — D693 Immune thrombocytopenic purpura: Secondary | ICD-10-CM

## 2014-06-30 DIAGNOSIS — E785 Hyperlipidemia, unspecified: Secondary | ICD-10-CM

## 2014-06-30 DIAGNOSIS — I2510 Atherosclerotic heart disease of native coronary artery without angina pectoris: Secondary | ICD-10-CM

## 2014-06-30 DIAGNOSIS — I10 Essential (primary) hypertension: Secondary | ICD-10-CM

## 2014-06-30 NOTE — Progress Notes (Signed)
Patient ID: Jamie Mejia, female   DOB: [birth year], 73 y.o.   MRN: [id number]     HPI: Jamie Mejia is a 73 y.o. female who presents to the office for one year cardiology evaluation.  Jamie Mejia has established coronary artery disease and underwent stenting of her proximal RCA in August 2008.  She had concomitant CAD with 60-70% distal narrowing, 50-70% circumflex O2 stenosis, 50% stenosis in the stomach LAD vessel. She did have coronary calcification involving her left main and LAD system. She underwent a two-year followup nuclear perfusion study in October 2013 which continued to be stable without scar or ischemia. She had hyperdynamic LV function.  Additional problems include hypertension, hyperlipidemia, as well as mild renal artery stenosis by duplex imaging. She was felt to have narrowing in the 1-59% range in her left renal artery with a proximal PSV of 196.  Jamie Mejia sees Dr. Andi Devon at 3 month intervals. She states laboratory has been checked recently.  Since I saw her one year ago, she denies any recurrent anginal symptoms.  She is unaware of palpitations. She has had some issues with spinal stenosis.  She's been caring for her sick husband.    Past Medical History  Diagnosis Date  . 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 Hypertension   . Hyperlipemia   . Coronary artery disease   . SOB (shortness of breath)   . Fatigue due to excessive exertion     Past Surgical History  Procedure Laterality Date  . Coronary artery stenting      stenting to mid right coronary artery at which time was noted  to have concomitant coronary artery disease with 60% to 70% distal narrowing, 50%to 70% circumflex OM2 stenosis, and 50% stenosis in a twin-like LAD segment  . Renal duplex scan      left proximal PSV velocity of 196, mid velocity of 197 and distal velocity of 110 suggesting a 1% 10 59% diameter reduction    Allergies  Allergen Reactions  .  Latex Itching  . Peanut Butter Flavor Hives    Current Outpatient Prescriptions  Medication Sig Dispense Refill  . acetaminophen (TYLENOL) 500 MG tablet Take 1,000 mg by mouth every 6 (six) hours as needed for moderate pain.    Marland Kitchen ALPRAZolam (XANAX) 1 MG tablet Take 0.5 mg by mouth at bedtime as needed for sleep.     Marland Kitchen aspirin 81 MG chewable tablet Chew by mouth.    Marland Kitchen buPROPion (WELLBUTRIN XL) 150 MG 24 hr tablet Take 150 mg by mouth daily.    . calcium carbonate (TUMS EX) 750 MG chewable tablet Chew by mouth.    . Cholecalciferol (VITAMIN D3) 2000 UNITS TABS Take 2,000 Units by mouth daily.    . clopidogrel (PLAVIX) 75 MG tablet Take 75 mg by mouth daily.    . Cyanocobalamin (VITAMIN B 12 PO) Take 1 tablet by mouth every morning.     . isosorbide mononitrate (IMDUR) 60 MG 24 hr tablet Take 1 tablet (60 mg total) by mouth daily. MUST KEEP APPOINTMENT 06/30/14 WITH DR Tresa Endo FOR FUTURE REFILLS. 30 tablet 0  . loratadine (CLARITIN) 10 MG tablet Take 10 mg by mouth daily as needed for allergies.     . metoprolol succinate (TOPROL-XL) 25 MG 24 hr tablet Take 25 mg by mouth daily.    . prednisoLONE acetate (PRED FORTE) 1 % ophthalmic suspension   0  . Probiotic Product (ALIGN) 4 MG CAPS Take 4 mg by  mouth daily.    . simvastatin (ZOCOR) 10 MG tablet Take by mouth.    . simvastatin (ZOCOR) 20 MG tablet Take 20 mg by mouth every morning.      No current facility-administered medications for this visit.    History   Social History  . Marital Status: Married    Spouse Name: N/A    Number of Children: N/A  . Years of Education: N/A   Occupational History  . Not on file.   Social History Main Topics  . Smoking status: Former Smoker -- 0.75 packs/day for 43 years    Types: Cigarettes    Start date: 08/27/1958    Quit date: 04/28/2002  . Smokeless tobacco: Never Used     Comment: quit 14 years ago  . Alcohol Use: No  . Drug Use: Not on file  . Sexual Activity: Not on file   Other  Topics Concern  . Not on file   Social History Narrative    Family History  Problem Relation Age of Onset  . Heart disease Mother   . Heart disease Brother    Additional social history is notable that she is married. She does not routinely exercise. There is no tobacco or alcohol use.  ROS General: Negative; No fevers, chills, or night sweats;  HEENT: Negative; No changes in vision or hearing, sinus congestion, difficulty swallowing Pulmonary: Negative; No cough, wheezing, shortness of breath, hemoptysis Cardiovascular: Negative; No chest pain, presyncope, syncope, palpitations GI: Negative; No nausea, vomiting, diarrhea, or abdominal pain GU: Negative; No dysuria, hematuria, or difficulty voiding Musculoskeletal: Spinal stenosis with intermittent leg weakness Hematologic/Oncology: Remote history of chronic ITP; no easy bruising, bleeding Endocrine: Negative; no heat/cold intolerance; no diabetes Neuro: Negative; no changes in balance, headaches Skin: Negative; No rashes or skin lesions Psychiatric: Negative; No behavioral problems, depression Sleep: Negative; No snoring, daytime sleepiness, hypersomnolence, bruxism, restless legs, hypnogognic hallucinations, no cataplexy Other comprehensive 14 point system review is negative.  PE BP 120/82 mmHg  Pulse 73  Ht 4\' 11"  (1.499 m)  Wt 144 lb 1.6 oz (65.363 kg)  BMI 29.09 kg/m2  General: Alert, oriented, no distress.  Skin: normal turgor, no rashes HEENT: Normocephalic, atraumatic. Pupils round and reactive; sclera anicteric;no lid lag.  Nose without nasal septal hypertrophy Mouth/Parynx benign; Mallinpatti scale 2/3 Neck: No JVD, no carotid bruits with normal carotid upstroke Chest wall: Nontender to palpation Lungs: clear to ausculatation and percussion; no wheezing or rales Heart: RRR, s1 s2 normal 1/6 systolic murmur. No S3 gallop.  No diastolic murmur.  No rubs thrills or heaves. Abdomen: Mild central adiposity. soft,  nontender; no hepatosplenomehaly, BS+; abdominal aorta nontender and not dilated by palpation; no bruits Back: No CVA tenderness Pulses 2+ Extremities: no clubbing cyanosis or edema, Homan's sign negative  Neurologic: grossly nonfocal Psychologic: normal affect and mood.  ECG (independently read by me): Normal sinus rhythm with sinus arrhythmia at 73 bpm.  Intervals are normal.  October 2014 ECG: Sinus rhythm with mild sinus arrhythmia. Normal intervals.  LABS:  BMET    Component Value Date/Time   NA 144 10/27/2013 1647   K 4.2 10/27/2013 1647   CL 106 10/27/2013 1647   CO2 25 10/27/2013 1647   GLUCOSE 120* 10/27/2013 1647   BUN 10 10/27/2013 1647   CREATININE 1.20* 10/27/2013 1647   CALCIUM 9.4 10/27/2013 1647   GFRNONAA 44* 10/27/2013 1647   GFRAA 51* 10/27/2013 1647     Hepatic Function Panel     Component  Value Date/Time   PROT 7.2 10/27/2013 1647   ALBUMIN 3.9 10/27/2013 1647   AST 20 10/27/2013 1647   ALT 16 10/27/2013 1647   ALKPHOS 104 10/27/2013 1647   BILITOT 0.4 10/27/2013 1647     CBC    Component Value Date/Time   WBC 5.7 01/21/2014 1021   WBC 6.0 10/27/2013 1647   WBC 6.0 11/22/2009 1458   RBC 4.53 01/21/2014 1021   RBC 4.39 10/27/2013 1647   RBC 4.24 11/22/2009 1458   HGB 14.5 01/21/2014 1021   HGB 14.2 10/27/2013 1647   HGB 14.2 11/22/2009 1458   HCT 42.0 01/21/2014 1021   HCT 40.2 10/27/2013 1647   HCT 40.8 11/22/2009 1458   PLT 223 01/21/2014 1021   PLT 201 10/27/2013 1647   PLT 238 11/22/2009 1458   MCV 93 01/21/2014 1021   MCV 91.6 10/27/2013 1647   MCV 96.2 11/22/2009 1458   MCH 32.0 01/21/2014 1021   MCH 32.3 10/27/2013 1647   MCH 33.4 11/22/2009 1458   MCHC 34.5 01/21/2014 1021   MCHC 35.3 10/27/2013 1647   MCHC 34.7 11/22/2009 1458   RDW 12.1 01/21/2014 1021   RDW 12.0 10/27/2013 1647   RDW 13.1 11/22/2009 1458   LYMPHSABS 1.9 01/21/2014 1021   LYMPHSABS 1.2 11/22/2009 1458   LYMPHSABS 1.0 06/13/2009 1055   MONOABS 0.3  11/22/2009 1458   MONOABS 0.4 06/13/2009 1055   EOSABS 0.1 01/21/2014 1021   EOSABS 0.0 11/22/2009 1458   EOSABS 0.1 06/13/2009 1055   BASOSABS 0.0 01/21/2014 1021   BASOSABS 0.0 11/22/2009 1458   BASOSABS 0.0 06/13/2009 1055     BNP No results found for: PROBNP  Lipid Panel  No results found for: CHOL   RADIOLOGY: No results found.    ASSESSMENT AND PLAN: Jamie Mejia is a 73 year old African-American female who is 7 years status post stenting of her RCA.  She has been treated medically with reference to her concomitant CAD involving her LAD, and circumflex vessels.  Her last nuclear study in October 2013 continued to show normal perfusion.  Her blood pressure today is stable on Toprol-XL 25 mg in addition to her isosorbide mononitrate.  She's not having any anginal symptoms with reference to her underlying CAD.  She is on simvastatin 20 mg for hyperlipidemia with target LDL less than 70.  She continues to be on Plavix and aspirin 81 mg and is tolerating this well without bleeding.  She has mild sinus arrhythmia noted on ECG, which is unremarkable and asymptomatic.  I will try to obtain the results of the recent laboratory that had been done by Dr. Andi DevonKimberly Chaddock to make certain she is being treated aggressively and medication adjustment is not needed.  She is well compensated.  There is a history of idiopathic thrombocytopenia which has been stable without relapse.  She denies easy bruisability.  She does have spinal stenosis.  I will see her in one year for cardiology evaluation or sooner if problems arise.  Time spent: 25 minutes   Lennette Biharihomas A. Kelly, MD, [facility]  06/30/2014 2:36 PM

## 2014-06-30 NOTE — Patient Instructions (Signed)
Your physician wants you to follow-up in: 1 year or sooner if needed with Dr. Kelly. No changes were made today in your therapy. You will receive a reminder letter in the mail two months in advance. If you don't receive a letter, please call our office to schedule the follow-up appointment. 

## 2014-07-27 ENCOUNTER — Telehealth: Payer: Self-pay | Admitting: Cardiovascular Disease

## 2014-07-27 NOTE — Telephone Encounter (Signed)
Attempted phone call to home number, no answer.

## 2014-07-27 NOTE — Telephone Encounter (Signed)
Returning call-please call on her home [phone].

## 2014-07-27 NOTE — Telephone Encounter (Signed)
Called - left message

## 2014-07-27 NOTE — Telephone Encounter (Signed)
Pt needed clarification on whether to continue taking isosorbide. Was confused because of attached message on bottle r/t following up for appt.   Call resolved, all pt questions addressed.

## 2014-07-27 NOTE — Telephone Encounter (Signed)
Returning your call. °

## 2014-07-27 NOTE — Telephone Encounter (Signed)
Pt called in stating that she just picked up her prescription for Isosorbide and she was informed that she will need the doctor's authorization for further refills. She would like to know if this is a medication that she will have to continue to take. Please call  Thanks

## 2014-08-03 ENCOUNTER — Telehealth: Payer: Self-pay | Admitting: Cardiovascular Disease

## 2014-08-03 NOTE — Telephone Encounter (Signed)
Patient states she is calling because she had a pain in left side under breast. She states it las for a few minutes, while waiting on the phone the pain had gone away. [place] ,they just received some upsetting news.She states she removed herself from the room. RN asked if she had shortness of breathe, sweating or radiation of pain. Patient states no to all the above she states she can point to the area..She states she used  Gas-x in the past.  Patient states she had an appointment in NOV 2015 with Dr Tresa EndoKelly-   No NTG has been given.RN informed patient may have discomfort because of the news she just heard,but if reoccur    RN informed patient  If discomfort comes back go to the nearest ER or urgent care.  Patient verbalized understanding.

## 2014-08-04 ENCOUNTER — Ambulatory Visit (HOSPITAL_BASED_OUTPATIENT_CLINIC_OR_DEPARTMENT_OTHER): Payer: 59 | Admitting: Family

## 2014-08-04 ENCOUNTER — Other Ambulatory Visit (HOSPITAL_BASED_OUTPATIENT_CLINIC_OR_DEPARTMENT_OTHER): Payer: 59 | Admitting: Lab

## 2014-08-04 ENCOUNTER — Telehealth: Payer: Self-pay | Admitting: Hematology & Oncology

## 2014-08-04 ENCOUNTER — Encounter: Payer: Self-pay | Admitting: Family

## 2014-08-04 DIAGNOSIS — D693 Immune thrombocytopenic purpura: Secondary | ICD-10-CM

## 2014-08-04 LAB — CBC WITH DIFFERENTIAL (CANCER CENTER ONLY)
BASO#: 0 10*3/uL (ref 0.0–0.2)
BASO%: 0.4 % (ref 0.0–2.0)
EOS ABS: 0.1 10*3/uL (ref 0.0–0.5)
EOS%: 1.9 % (ref 0.0–7.0)
HCT: 41.1 % (ref 34.8–46.6)
HGB: 14 g/dL (ref 11.6–15.9)
LYMPH#: 1.8 10*3/uL (ref 0.9–3.3)
LYMPH%: 33.5 % (ref 14.0–48.0)
MCH: 31.8 pg (ref 26.0–34.0)
MCHC: 34.1 g/dL (ref 32.0–36.0)
MCV: 93 fL (ref 81–101)
MONO#: 0.5 10*3/uL (ref 0.1–0.9)
MONO%: 8.7 % (ref 0.0–13.0)
NEUT%: 55.5 % (ref 39.6–80.0)
NEUTROS ABS: 3 10*3/uL (ref 1.5–6.5)
PLATELETS: 226 10*3/uL (ref 145–400)
RBC: 4.4 10*6/uL (ref 3.70–5.32)
RDW: 12.2 % (ref 11.1–15.7)
WBC: 5.4 10*3/uL (ref 3.9–10.0)

## 2014-08-04 LAB — CHCC SATELLITE - SMEAR

## 2014-08-04 NOTE — Progress Notes (Signed)
[facility]  [phone] [phone]  ID: Jamie Mejia OB: [birth year] MR#: [id number] GNF#:[id number] Patient Care Team: Andi Devon, MD as PCP - General (Internal Medicine)  DIAGNOSIS: Chronic immune thrombocytopenia  INTERVAL HISTORY: Jamie Mejia it here today for a follow-up. She is doing better.  She stays busy taking care of her husband who is being treated at [facility] for relapsed myeloma. Christmas was rough on her.  She denies fever, chills, n/v, cough, rash, headache, dizziness, SOB, chest pain, palpitations, abdominal pain, constipation, diarrhea, blood in urine or stool.  No swelling, tenderness, numbness or tingling in her extremities.  Her appetite is good and she is staying hydrated. Her weight is stable.   CURRENT TREATMENT: Observation  REVIEW OF SYSTEMS: All other 10 point review of systems is negative.   PAST MEDICAL HISTORY: Past Medical History  Diagnosis Date  . 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 Hypertension   . Hyperlipemia   . Coronary artery disease   . SOB (shortness of breath)   . Fatigue due to excessive exertion     PAST SURGICAL HISTORY: Past Surgical History  Procedure Laterality Date  . Coronary artery stenting      stenting to mid right coronary artery at which time was noted  to have concomitant coronary artery disease with 60% to 70% distal narrowing, 50%to 70% circumflex OM2 stenosis, and 50% stenosis in a twin-like LAD segment  . Renal duplex scan      left proximal PSV velocity of 196, mid velocity of 197 and distal velocity of 110 suggesting a 1% 10 59% diameter reduction    FAMILY HISTORY Family History  Problem Relation Age of Onset  . Heart disease Mother   . Heart disease Brother     GYNECOLOGIC HISTORY:  No LMP recorded. Patient is postmenopausal.   SOCIAL HISTORY: History   Social History  . Marital Status: Married    Spouse Name: N/A    Number of Children:  N/A  . Years of Education: N/A   Occupational History  . Not on file.   Social History Main Topics  . Smoking status: Former Smoker -- 0.75 packs/day for 43 years    Types: Cigarettes    Start date: 08/27/1958    Quit date: 04/28/2002  . Smokeless tobacco: Never Used     Comment: quit 14 years ago  . Alcohol Use: No  . Drug Use: Not on file  . Sexual Activity: Not on file   Other Topics Concern  . Not on file   Social History Narrative   ADVANCED DIRECTIVES:  <no information>  HEALTH MAINTENANCE: History  Substance Use Topics  . Smoking status: Former Smoker -- 0.75 packs/day for 43 years    Types: Cigarettes    Start date: 08/27/1958    Quit date: 04/28/2002  . Smokeless tobacco: Never Used     Comment: quit 14 years ago  . Alcohol Use: No   Colonoscopy: PAP: Bone density: Lipid panel:  Allergies  Allergen Reactions  . Latex Itching  . Peanut Butter Flavor Hives    Current Outpatient Prescriptions  Medication Sig Dispense Refill  . acetaminophen (TYLENOL) 500 MG tablet Take 1,000 mg by mouth every 6 (six) hours as needed for moderate pain.    Marland Kitchen ALPRAZolam (XANAX) 1 MG tablet Take 0.5 mg by mouth at bedtime as needed for sleep.     Marland Kitchen aspirin 81 MG chewable tablet Chew by mouth.    Marland Kitchen buPROPion [facility]  XL) 150 MG 24 hr tablet Take 150 mg by mouth daily.    . calcium carbonate (TUMS EX) 750 MG chewable tablet Chew by mouth.    . Cholecalciferol (VITAMIN D3) 2000 UNITS TABS Take 2,000 Units by mouth daily.    . clopidogrel (PLAVIX) 75 MG tablet Take 75 mg by mouth daily.    . Cyanocobalamin (VITAMIN B 12 PO) Take 1 tablet by mouth every morning.     . isosorbide mononitrate (IMDUR) 60 MG 24 hr tablet Take 1 tablet (60 mg total) by mouth daily. MUST KEEP APPOINTMENT 06/30/14 WITH DR Tresa EndoKELLY FOR FUTURE REFILLS. 30 tablet 0  . loratadine (CLARITIN) 10 MG tablet Take 10 mg by mouth daily as needed for allergies.     . metoprolol succinate (TOPROL-XL) 25 MG 24 hr  tablet Take 25 mg by mouth daily.    . prednisoLONE acetate (PRED FORTE) 1 % ophthalmic suspension   0  . Probiotic Product (ALIGN) 4 MG CAPS Take 4 mg by mouth daily.    . simvastatin (ZOCOR) 10 MG tablet Take by mouth.    . simvastatin (ZOCOR) 20 MG tablet Take 20 mg by mouth every morning.      No current facility-administered medications for this visit.    OBJECTIVE: Filed Vitals:   08/04/14 1036  BP: 137/87  Pulse: 74  Temp: 97.8 F (36.6 C)  Resp: 20    Filed Weights   08/04/14 1036  Weight: 142 lb (64.411 kg)   ECOG FS:0 - Asymptomatic Ocular: Sclerae unicteric, pupils equal, round and reactive to light Ear-nose-throat: Oropharynx clear, dentition fair Lymphatic: No cervical or supraclavicular adenopathy Lungs no rales or rhonchi, good excursion bilaterally Heart regular rate and rhythm, no murmur appreciated Abd soft, nontender, positive bowel sounds MSK no focal spinal tenderness, no joint edema Neuro: non-focal, well-oriented, appropriate affect Breasts: Deferred  LAB RESULTS: CMP     Component Value Date/Time   NA 144 10/27/2013 1647   K 4.2 10/27/2013 1647   CL 106 10/27/2013 1647   CO2 25 10/27/2013 1647   GLUCOSE 120* 10/27/2013 1647   BUN 10 10/27/2013 1647   CREATININE 1.20* 10/27/2013 1647   CALCIUM 9.4 10/27/2013 1647   PROT 7.2 10/27/2013 1647   ALBUMIN 3.9 10/27/2013 1647   AST 20 10/27/2013 1647   ALT 16 10/27/2013 1647   ALKPHOS 104 10/27/2013 1647   BILITOT 0.4 10/27/2013 1647   GFRNONAA 44* 10/27/2013 1647   GFRAA 51* 10/27/2013 1647   INo results found for: SPEP, UPEP Lab Results  Component Value Date   WBC 5.4 08/04/2014   NEUTROABS 3.0 08/04/2014   HGB 14.0 08/04/2014   HCT 41.1 08/04/2014   MCV 93 08/04/2014   PLT 226 08/04/2014   No results found for: LABCA2 No components found for: [id number] No results for input(s): INR in the last 168 hours.  STUDIES: No results found.  ASSESSMENT/PLAN: Jamie Mejia is 74 year old  African female with chronic immune thrombocytopenia. She was treated with Rituxan in November of 2014. She has had no problems. She is asymptomatic and doing well.  Today her platelets were 226 Hgb 14.0 MCV 93.  We will see her back in 6 months for labs and follow-up.  She knows to call here with any questions or concerns and to go to the ED in the event of an emergency. We can certainly see her sooner if need be.   Verdie MosherINCINNATI,SARAH M, NP 08/04/2014 11:07 AM

## 2014-08-04 NOTE — Telephone Encounter (Signed)
Mailed July schedule °

## 2014-08-22 ENCOUNTER — Other Ambulatory Visit: Payer: Self-pay | Admitting: Cardiovascular Disease

## 2014-08-22 NOTE — Telephone Encounter (Signed)
Rx refill sent to patient pharmacy   

## 2014-09-01 ENCOUNTER — Other Ambulatory Visit: Payer: Self-pay | Admitting: Family

## 2014-11-26 ENCOUNTER — Emergency Department (HOSPITAL_COMMUNITY): Payer: Medicare Other

## 2014-11-26 ENCOUNTER — Encounter (HOSPITAL_COMMUNITY): Payer: Self-pay | Admitting: Emergency Medicine

## 2014-11-26 ENCOUNTER — Emergency Department (HOSPITAL_COMMUNITY)
Admission: EM | Admit: 2014-11-26 | Discharge: 2014-11-26 | Disposition: A | Discharge status: Home / Self Care | Payer: Medicare Other | Attending: Emergency Medicine | Admitting: Emergency Medicine

## 2014-11-26 DIAGNOSIS — I2510 Atherosclerotic heart disease of native coronary artery without angina pectoris: Secondary | ICD-10-CM | POA: Insufficient documentation

## 2014-11-26 DIAGNOSIS — Z79899 Other long term (current) drug therapy: Secondary | ICD-10-CM | POA: Insufficient documentation

## 2014-11-26 DIAGNOSIS — Z862 Personal history of diseases of the blood and blood-forming organs and certain disorders involving the immune mechanism: Secondary | ICD-10-CM | POA: Insufficient documentation

## 2014-11-26 DIAGNOSIS — Y9289 Other specified places as the place of occurrence of the external cause: Secondary | ICD-10-CM | POA: Insufficient documentation

## 2014-11-26 DIAGNOSIS — E785 Hyperlipidemia, unspecified: Secondary | ICD-10-CM | POA: Insufficient documentation

## 2014-11-26 DIAGNOSIS — T18128A Food in esophagus causing other injury, initial encounter: Secondary | ICD-10-CM | POA: Diagnosis not present

## 2014-11-26 DIAGNOSIS — I10 Essential (primary) hypertension: Secondary | ICD-10-CM | POA: Insufficient documentation

## 2014-11-26 DIAGNOSIS — Z7902 Long term (current) use of antithrombotics/antiplatelets: Secondary | ICD-10-CM | POA: Diagnosis not present

## 2014-11-26 DIAGNOSIS — T17228A Food in pharynx causing other injury, initial encounter: Secondary | ICD-10-CM | POA: Insufficient documentation

## 2014-11-26 DIAGNOSIS — Z87891 Personal history of nicotine dependence: Secondary | ICD-10-CM | POA: Insufficient documentation

## 2014-11-26 DIAGNOSIS — Z9861 Coronary angioplasty status: Secondary | ICD-10-CM | POA: Insufficient documentation

## 2014-11-26 DIAGNOSIS — Z91040 Latex allergy status: Secondary | ICD-10-CM | POA: Diagnosis not present

## 2014-11-26 DIAGNOSIS — Y998 Other external cause status: Secondary | ICD-10-CM | POA: Diagnosis not present

## 2014-11-26 DIAGNOSIS — T189XXA Foreign body of alimentary tract, part unspecified, initial encounter: Secondary | ICD-10-CM

## 2014-11-26 DIAGNOSIS — K222 Esophageal obstruction: Secondary | ICD-10-CM

## 2014-11-26 DIAGNOSIS — Y9389 Activity, other specified: Secondary | ICD-10-CM | POA: Diagnosis not present

## 2014-11-26 DIAGNOSIS — X58XXXA Exposure to other specified factors, initial encounter: Secondary | ICD-10-CM | POA: Diagnosis not present

## 2014-11-26 NOTE — ED Notes (Signed)
Pt was eating a ham biscuit around 0930 and had a piece of ham stuck in throat. Now says piece of ham has moved and feels like it's stuck further down the esophagus (pointing to mid sternal portion of chest). Has been drinking water without issue. No emesis. Has not eaten solids since incident. This has never happened to patient. Able to speak full/clear sentences. RR even/unlabored. No SOB noted. No drooling noted. Wasn't able to take HTN medications today (usually takes after eating). No vocal changes.

## 2014-11-26 NOTE — Discharge Instructions (Signed)
Your xray is normal - please follow up with a GI specialist in next 2 weeks - if you find that you're unable to swallow food or liquids without vomiting please return to the hospital immediately otherwise he may need an endoscopy procedure to look for strictures or narrowing of your esophagus.  Please call your doctor for a followup appointment within 24-48 hours. When you talk to your doctor please let them know that you were seen in the emergency department and have them acquire all of your records so that they can discuss the findings with you and formulate a treatment plan to fully care for your new and ongoing problems.

## 2014-11-26 NOTE — ED Provider Notes (Signed)
CSN: [id number]     Arrival date & time 11/26/14  1231 History   First MD Initiated Contact with Patient 11/26/14 1238     Chief Complaint  Patient presents with  . Food stuck in throat      (Consider location/radiation/quality/duration/timing/severity/associated sxs/prior Treatment) HPI Comments: 74 year old female presents by ambulance after feeling as though she had food lodged in her throat. She was eating a homemade ham biscuit when she felt as though a poorly chewed piece of ham got stuck around her mid throat, she was given some water and this helped move it down, she has been tolerating water without difficulty, no vomiting, no blood, no coughing, a discomfort in her chest feeling as though there is a foreign body still present.  She is tolerating the fluids, she has not tried to eat solid foods, she has no history of dysphagia, no history of strictures or gastroenterology complications. No history of chest or abdominal surgery.  The history is provided by the patient.    Past Medical History  Diagnosis Date  . 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 Hypertension   . Hyperlipemia   . Coronary artery disease   . SOB (shortness of breath)   . Fatigue due to excessive exertion    Past Surgical History  Procedure Laterality Date  . Coronary artery stenting      stenting to mid right coronary artery at which time was noted  to have concomitant coronary artery disease with 60% to 70% distal narrowing, 50%to 70% circumflex OM2 stenosis, and 50% stenosis in a twin-like LAD segment  . Renal duplex scan      left proximal PSV velocity of 196, mid velocity of 197 and distal velocity of 110 suggesting a 1% 10 59% diameter reduction   Family History  Problem Relation Age of Onset  . Heart disease Mother   . Heart disease Brother    History  Substance Use Topics  . Smoking status: Former Smoker -- 0.75 packs/day for 43 years    Types: Cigarettes    Start date:  08/27/1958    Quit date: 04/28/2002  . Smokeless tobacco: Never Used     Comment: quit 14 years ago  . Alcohol Use: No   OB History    No data available     Review of Systems  All other systems reviewed and are negative.     Allergies  Latex and Peanut butter flavor  Home Medications   Prior to Admission medications   Medication Sig Start Date End Date Taking? Authorizing Provider  acetaminophen (TYLENOL) 500 MG tablet Take 1,000 mg by mouth every 6 (six) hours as needed for moderate pain.   Yes Historical Provider, MD  ALPRAZolam Prudy Feeler(XANAX) 1 MG tablet Take 0.5 mg by mouth at bedtime as needed for sleep.    Yes Historical Provider, MD  buPROPion (WELLBUTRIN XL) 150 MG 24 hr tablet Take 150 mg by mouth daily.   Yes Historical Provider, MD  calcium-vitamin D (OSCAL WITH D) 500-200 MG-UNIT per tablet Take 1 tablet by mouth daily with breakfast.   Yes Historical Provider, MD  Cholecalciferol (VITAMIN D3) 2000 UNITS TABS Take 2,000 Units by mouth daily.   Yes Historical Provider, MD  clopidogrel (PLAVIX) 75 MG tablet Take 75 mg by mouth daily.   Yes Historical Provider, MD  Cyanocobalamin (VITAMIN B 12 PO) Take 1 tablet by mouth every morning.    Yes Historical Provider, MD  isosorbide mononitrate (IMDUR) 60 MG 24 hr  tablet TAKE 1 TABLET BY MOUTH EVERY DAY 08/22/14  Yes Lennette Bihari, MD  loratadine (CLARITIN) 10 MG tablet Take 10 mg by mouth daily as needed for allergies.    Yes Historical Provider, MD  metoprolol succinate (TOPROL-XL) 25 MG 24 hr tablet Take 25 mg by mouth every morning.    Yes Historical Provider, MD  Probiotic Product (ALIGN) 4 MG CAPS Take 4 mg by mouth daily.   Yes Historical Provider, MD  simethicone (MYLICON) 80 MG chewable tablet Chew 80 mg by mouth every 6 (six) hours as needed for flatulence.   Yes Historical Provider, MD  simvastatin (ZOCOR) 10 MG tablet Take 10 mg by mouth daily.    Yes Historical Provider, MD  isosorbide mononitrate (IMDUR) 60 MG 24 hr  tablet Take 1 tablet (60 mg total) by mouth daily. MUST KEEP APPOINTMENT 06/30/14 WITH DR Tresa Endo FOR FUTURE REFILLS. Patient not taking: Reported on 11/26/2014 06/20/14   Lennette Bihari, MD   BP 189/108 mmHg  Pulse 71  Temp(Src) 97.9 F (36.6 C) (Oral)  Resp 17  SpO2 100% Physical Exam  Constitutional: She appears well-developed and well-nourished. No distress.  HENT:  Head: Normocephalic and atraumatic.  Mouth/Throat: Oropharynx is clear and moist. No oropharyngeal exudate.  No foreign body seen, no bleeding or injury  Eyes: Conjunctivae and EOM are normal. Pupils are equal, round, and reactive to light. Right eye exhibits no discharge. Left eye exhibits no discharge. No scleral icterus.  Neck: Normal range of motion. Neck supple. No JVD present. No thyromegaly present.  Cardiovascular: Normal rate, regular rhythm, normal heart sounds and intact distal pulses.  Exam reveals no gallop and no friction rub.   No murmur heard. Pulmonary/Chest: Effort normal and breath sounds normal. No respiratory distress. She has no wheezes. She has no rales.  Abdominal: Soft. Bowel sounds are normal. She exhibits no distension and no mass. There is no tenderness.  Musculoskeletal: Normal range of motion. She exhibits no edema or tenderness.  Lymphadenopathy:    She has no cervical adenopathy.  Neurological: She is alert. Coordination normal.  Skin: Skin is warm and dry. No rash noted. No erythema.  Psychiatric: She has a normal mood and affect. Her behavior is normal.  Nursing note and vitals reviewed.   ED Course  Procedures (including critical care time) Labs Review Labs Reviewed - No data to display  Imaging Review Dg Chest 2 View  11/26/2014   CLINICAL DATA:  Patient eating piece of ham ; feels meat stuck in throat region  EXAM: CHEST  2 VIEW  COMPARISON:  October 27, 2013  FINDINGS: There is mild atelectatic change in the left mid lung. The lungs are otherwise clear. Heart size and pulmonary  vascularity are normal. No adenopathy. No radiopaque foreign body is seen.  IMPRESSION: Mild atelectasis left mid lung. No radiopaque foreign body appreciable by radiography.   Electronically Signed   By: Bretta Bang III M.D.   On: 11/26/2014 13:35      MDM   Final diagnoses:  Swallowed foreign body  Esophageal obstruction due to food impaction    The patient is well-appearing, she does have some hypertension but has normal vital signs otherwise, chest x-ray to evaluate for foreign body, doubt esophageal injury or rupture, tolerating fluids, advance to solids, if she does not tolerate will try glucagon  Xray neg,  Swallowed solids without diffcuilty - now globus sensation is gone - refer to GI.  Pt expressess understanding.  Eber Hong,  MD 11/26/14 1423

## 2014-12-02 ENCOUNTER — Other Ambulatory Visit: Payer: Self-pay | Admitting: Gastroenterology

## 2014-12-02 DIAGNOSIS — R1310 Dysphagia, unspecified: Secondary | ICD-10-CM

## 2014-12-06 ENCOUNTER — Ambulatory Visit (HOSPITAL_COMMUNITY)
Admission: RE | Admit: 2014-12-06 | Discharge: 2014-12-06 | Disposition: A | Discharge status: Home / Self Care | Payer: Medicare Other | Source: Ambulatory Visit | Attending: Gastroenterology | Admitting: Gastroenterology

## 2014-12-06 DIAGNOSIS — R1310 Dysphagia, unspecified: Secondary | ICD-10-CM | POA: Diagnosis present

## 2014-12-06 DIAGNOSIS — K224 Dyskinesia of esophagus: Secondary | ICD-10-CM | POA: Insufficient documentation

## 2015-01-23 ENCOUNTER — Other Ambulatory Visit: Payer: Self-pay

## 2015-01-24 ENCOUNTER — Encounter: Payer: Self-pay | Admitting: Hematology & Oncology

## 2015-02-02 ENCOUNTER — Encounter: Payer: Self-pay | Admitting: Hematology & Oncology

## 2015-02-02 ENCOUNTER — Telehealth: Payer: Self-pay | Admitting: Hematology & Oncology

## 2015-02-02 ENCOUNTER — Ambulatory Visit (HOSPITAL_BASED_OUTPATIENT_CLINIC_OR_DEPARTMENT_OTHER): Payer: Medicare Other | Admitting: Hematology & Oncology

## 2015-02-02 ENCOUNTER — Other Ambulatory Visit (HOSPITAL_BASED_OUTPATIENT_CLINIC_OR_DEPARTMENT_OTHER): Payer: Medicare Other

## 2015-02-02 VITALS — BP 136/67 | HR 70 | Temp 97.8°F | Resp 14 | Ht 59.0 in | Wt 141.0 lb

## 2015-02-02 DIAGNOSIS — D693 Immune thrombocytopenic purpura: Secondary | ICD-10-CM | POA: Diagnosis not present

## 2015-02-02 LAB — CBC WITH DIFFERENTIAL (CANCER CENTER ONLY)
BASO#: 0 10*3/uL (ref 0.0–0.2)
BASO%: 0.5 % (ref 0.0–2.0)
EOS%: 4.9 % (ref 0.0–7.0)
Eosinophils Absolute: 0.3 10*3/uL (ref 0.0–0.5)
HCT: 41.8 % (ref 34.8–46.6)
HEMOGLOBIN: 14.4 g/dL (ref 11.6–15.9)
LYMPH#: 2.1 10*3/uL (ref 0.9–3.3)
LYMPH%: 37.7 % (ref 14.0–48.0)
MCH: 32.4 pg (ref 26.0–34.0)
MCHC: 34.4 g/dL (ref 32.0–36.0)
MCV: 94 fL (ref 81–101)
MONO#: 0.5 10*3/uL (ref 0.1–0.9)
MONO%: 9.1 % (ref 0.0–13.0)
NEUT#: 2.6 10*3/uL (ref 1.5–6.5)
NEUT%: 47.8 % (ref 39.6–80.0)
Platelets: 225 10*3/uL (ref 145–400)
RBC: 4.44 10*6/uL (ref 3.70–5.32)
RDW: 12 % (ref 11.1–15.7)
WBC: 5.5 10*3/uL (ref 3.9–10.0)

## 2015-02-02 LAB — CHCC SATELLITE - SMEAR

## 2015-02-02 NOTE — Progress Notes (Signed)
Hematology and Oncology Follow Up Visit  Moses MannersChristine C Renwick [id number] 05/14/1941 74 y.o. 02/02/2015   Principle Diagnosis:   Chronic immune thrombocytopenia  Current Therapy:    Observation     Interim History:  Ms.  Renae GlossShelton is back for follow-up area and her husband passed away in February. She is at peace with this. It is been very hard for her but she is comfortable knowing that he is with God and Jesus up in heaven.  There's been no problems with bleeding or bruising. She's had no issues with weight loss or weight gain. She is trying to exercise area  Her appetite has been okay. She's not had any nausea or vomiting. She's had no change in bowel or bladder habits. There's been no leg swelling. She had no rashes. She's had no bruises.  Overall, her performance status is ECOG 1.  Medications:  Current outpatient prescriptions:  .  acetaminophen (TYLENOL) 500 MG tablet, Take 1,000 mg by mouth every 6 (six) hours as needed for moderate pain., Disp: , Rfl:  .  ALPRAZolam (XANAX) 1 MG tablet, Take 0.5 mg by mouth at bedtime as needed for sleep. , Disp: , Rfl:  .  buPROPion (WELLBUTRIN XL) 150 MG 24 hr tablet, Take 150 mg by mouth daily., Disp: , Rfl:  .  calcium-vitamin D (OSCAL WITH D) 500-200 MG-UNIT per tablet, Take 1 tablet by mouth daily with breakfast., Disp: , Rfl:  .  Cholecalciferol (VITAMIN D3) 2000 UNITS TABS, Take 2,000 Units by mouth daily., Disp: , Rfl:  .  clopidogrel (PLAVIX) 75 MG tablet, Take 75 mg by mouth daily., Disp: , Rfl:  .  Cyanocobalamin (VITAMIN B 12 PO), Take 1 tablet by mouth every morning. , Disp: , Rfl:  .  isosorbide mononitrate (IMDUR) 60 MG 24 hr tablet, TAKE 1 TABLET BY MOUTH EVERY DAY, Disp: 30 tablet, Rfl: 10 .  loratadine (CLARITIN) 10 MG tablet, Take 10 mg by mouth daily as needed for allergies. , Disp: , Rfl:  .  metoprolol succinate (TOPROL-XL) 25 MG 24 hr tablet, Take 25 mg by mouth every morning. , Disp: , Rfl:  .  omeprazole (PRILOSEC) 40 MG  capsule, Take 40 mg by mouth daily., Disp: , Rfl: 12 .  Probiotic Product (ALIGN) 4 MG CAPS, Take 4 mg by mouth daily., Disp: , Rfl:  .  simethicone (MYLICON) 80 MG chewable tablet, Chew 80 mg by mouth every 6 (six) hours as needed for flatulence., Disp: , Rfl:  .  simvastatin (ZOCOR) 10 MG tablet, Take 10 mg by mouth daily. , Disp: , Rfl:   Allergies:  Allergies  Allergen Reactions  . Latex Itching  . Peanut Butter Flavor Hives    Past Medical History, Surgical history, Social history, and Family History were reviewed and updated.  Review of Systems: As above  Physical Exam:  height is 4\' 11"  (1.499 m) and weight is 141 lb (63.957 kg). Her oral temperature is 97.8 F (36.6 C). Her blood pressure is 136/67 and her pulse is 70. Her respiration is 14.   Lungs are clear. Cardiac exam regular in rhythm. Abdomen soft. No palpable liver or spleen. Skin exam no ecchymoses or petechia. Extremities shows no clubbing cyanosis or edema. Neurological exam is non-focal. Exam no tenderness over the spine. Lymph nodes are nonpalpable.  Lab Results  Component Value Date   WBC 5.5 02/02/2015   HGB 14.4 02/02/2015   HCT 41.8 02/02/2015   MCV 94 02/02/2015   PLT 225  02/02/2015     Chemistry      Component Value Date/Time   NA 144 10/27/2013 1647   K 4.2 10/27/2013 1647   CL 106 10/27/2013 1647   CO2 25 10/27/2013 1647   BUN 10 10/27/2013 1647   CREATININE 1.20* 10/27/2013 1647      Component Value Date/Time   CALCIUM 9.4 10/27/2013 1647   ALKPHOS 104 10/27/2013 1647   AST 20 10/27/2013 1647   ALT 16 10/27/2013 1647   BILITOT 0.4 10/27/2013 1647         Impression and Plan: Ms. Zhang is 74 year old African female with chronic immune thrombocytopenia. We treated her with Rituxan. This was back in November 2014.  I think we can get her back in one year now. This would be helpful for her. She would like this.  We had a good prayer session regarding her husband who passed away in  09/13/2022.   Josph Macho, MD 7/7/201611:05 AM

## 2015-02-02 NOTE — Telephone Encounter (Signed)
WENT AHEAD AND SCHED APPT FOR YEAR PT WANTS TO CALL IN APPT AT A LATER TIME

## 2015-07-07 ENCOUNTER — Other Ambulatory Visit: Payer: Self-pay | Admitting: Cardiovascular Disease

## 2015-09-02 ENCOUNTER — Other Ambulatory Visit: Payer: Self-pay | Admitting: Cardiovascular Disease

## 2015-09-04 NOTE — Telephone Encounter (Signed)
Rx(s) sent to pharmacy electronically.  

## 2015-10-08 ENCOUNTER — Other Ambulatory Visit: Payer: Self-pay | Admitting: Cardiovascular Disease

## 2015-10-18 ENCOUNTER — Other Ambulatory Visit: Payer: Self-pay | Admitting: Cardiovascular Disease

## 2015-10-19 NOTE — Telephone Encounter (Signed)
Rx(s) sent to pharmacy electronically.  

## 2015-11-01 ENCOUNTER — Other Ambulatory Visit: Payer: Self-pay | Admitting: Cardiovascular Disease

## 2015-11-01 NOTE — Telephone Encounter (Signed)
Rx(s) sent to pharmacy electronically.  

## 2015-11-22 ENCOUNTER — Telehealth: Payer: Self-pay | Admitting: Cardiovascular Disease

## 2015-11-22 NOTE — Telephone Encounter (Signed)
Acknowledged.

## 2015-11-22 NOTE — Telephone Encounter (Signed)
Pt needed a [employer] appt for future refills-appt 02-19-16 at 315pm

## 2015-11-30 ENCOUNTER — Other Ambulatory Visit: Payer: Self-pay | Admitting: Cardiovascular Disease

## 2015-11-30 NOTE — Telephone Encounter (Signed)
Rx(s) sent to pharmacy electronically. OV 02/19/16

## 2016-01-26 ENCOUNTER — Other Ambulatory Visit: Payer: Self-pay | Admitting: Cardiovascular Disease

## 2016-02-02 ENCOUNTER — Ambulatory Visit: Payer: Medicare Other | Admitting: Hematology & Oncology

## 2016-02-02 ENCOUNTER — Other Ambulatory Visit: Payer: Medicare Other

## 2016-02-19 ENCOUNTER — Ambulatory Visit (INDEPENDENT_AMBULATORY_CARE_PROVIDER_SITE_OTHER): Payer: Medicare Other | Admitting: Cardiovascular Disease

## 2016-02-19 ENCOUNTER — Encounter: Payer: Self-pay | Admitting: Cardiovascular Disease

## 2016-02-19 VITALS — BP 112/68 | HR 70 | Ht <= 58 in | Wt 133.0 lb

## 2016-02-19 DIAGNOSIS — I2510 Atherosclerotic heart disease of native coronary artery without angina pectoris: Secondary | ICD-10-CM

## 2016-02-19 DIAGNOSIS — I10 Essential (primary) hypertension: Secondary | ICD-10-CM

## 2016-02-19 NOTE — Progress Notes (Signed)
Patient ID: Jamie Mejia, female   DOB: 09/09/1940, 75 y.o.   MRN: [id number]     Primary M.D.: Dr. Willey Blade  HPI: Jamie Mejia is a 75 y.o. female who presents to the office for a 19 month follow-up cardiology evaluation.  Jamie Mejia has established coronary artery disease and underwent stenting of her proximal RCA in August 2008.  She had concomitant CAD with 60-70% distal narrowing, 50-70% circumflex O2 stenosis, 50% stenosis in the stomach LAD vessel. She did have coronary calcification involving her left main and LAD system. She underwent a two-year followup nuclear perfusion study in October 2013 which continued to be stable without scar or ischemia. She had hyperdynamic LV function.  Additional problems include hypertension, hyperlipidemia, as well as mild renal artery stenosis by duplex imaging. She was felt to have narrowing in the 1-59% range in her left renal artery with a proximal PSV of 196.  Jamie Mejia sees Dr. Willey Blade at 3 month intervals. She states laboratory has been checked recently.  Since I last saw her in December 2015 .  Her husband unfortunately died in 09-14-14.  She is still having difficulty with this.  She denies any recurrent anginal symptoms.  She has been taking Toprol-XL 25 mg and isosorbide 60 mg daily.  She denies any dizziness or palpitations.  She is diabetic on metformin.  She has hyperlipidemia and continues to take simvastatin.  She presents for follow-up cardiology evaluation.  Past Medical History:  Diagnosis Date  . 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 Coronary artery disease   . Fatigue due to excessive exertion   . Hyperlipemia   . Hypertension   . SOB (shortness of breath)     Past Surgical History:  Procedure Laterality Date  . coronary artery stenting     stenting to mid right coronary artery at which time was noted  to have concomitant coronary artery disease with 60% to 70% distal  narrowing, 50%to 70% circumflex OM2 stenosis, and 50% stenosis in a twin-like LAD segment  . renal duplex scan     left proximal PSV velocity of 196, mid velocity of 197 and distal velocity of 110 suggesting a 1% 10 59% diameter reduction    Allergies  Allergen Reactions  . Latex Itching  . Peanut Butter Flavor Hives    Current Outpatient Prescriptions  Medication Sig Dispense Refill  . acetaminophen (TYLENOL) 500 MG tablet Take 1,000 mg by mouth every 6 (six) hours as needed for moderate pain.    Marland Kitchen ALPRAZolam (XANAX) 1 MG tablet Take 0.5 mg by mouth at bedtime as needed for sleep.     Marland Kitchen buPROPion (WELLBUTRIN XL) 150 MG 24 hr tablet Take 150 mg by mouth daily.    . calcium-vitamin D (OSCAL WITH D) 500-200 MG-UNIT per tablet Take 1 tablet by mouth daily with breakfast.    . Cholecalciferol (VITAMIN D3) 2000 UNITS TABS Take 2,000 Units by mouth daily.    . clopidogrel (PLAVIX) 75 MG tablet Take 75 mg by mouth daily.    . Cyanocobalamin (VITAMIN B 12 PO) Take 1 tablet by mouth every morning.     . isosorbide mononitrate (IMDUR) 60 MG 24 hr tablet TAKE 1 TABLET (60 MG TOTAL) BY MOUTH DAILY. 30 tablet 1  . loratadine (CLARITIN) 10 MG tablet Take 10 mg by mouth daily as needed for allergies.     . metFORMIN (GLUCOPHAGE) 500 MG tablet Take 250 mg by mouth daily.    Marland Kitchen  metoprolol succinate (TOPROL-XL) 25 MG 24 hr tablet Take 25 mg by mouth every morning.     . Probiotic Product (ALIGN) 4 MG CAPS Take 4 mg by mouth daily.    . simvastatin (ZOCOR) 20 MG tablet Take 20 mg by mouth daily.    . TURMERIC PO Take 1 tablet by mouth daily.     No current facility-administered medications for this visit.     Social History   Social History  . Marital status: Married    Spouse name: N/A  . Number of children: N/A  . Years of education: N/A   Occupational History  . Not on file.   Social History Main Topics  . Smoking status: Former Smoker    Packs/day: 0.75    Years: 43.00    Types:  Cigarettes    Start date: 08/27/1958    Quit date: 04/28/2002  . Smokeless tobacco: Never Used     Comment: quit 14 years ago  . Alcohol use No  . Drug use: Unknown  . Sexual activity: Not on file   Other Topics Concern  . Not on file   Social History Narrative  . No narrative on file    Family History  Problem Relation Age of Onset  . Heart disease Mother   . Heart disease Brother    Additional social history is notable that she is widowed since February 2016.  She does not routinely exercise. There is no tobacco or alcohol use.  ROS General: Negative; No fevers, chills, or night sweats;  HEENT: Negative; No changes in vision or hearing, sinus congestion, difficulty swallowing Pulmonary: Negative; No cough, wheezing, shortness of breath, hemoptysis Cardiovascular: Negative; No chest pain, presyncope, syncope, palpitations GI: Negative; No nausea, vomiting, diarrhea, or abdominal pain GU: Negative; No dysuria, hematuria, or difficulty voiding Musculoskeletal: Spinal stenosis with intermittent leg weakness Hematologic/Oncology: Remote history of chronic ITP; no easy bruising, bleeding Endocrine: Negative; no heat/cold intolerance; no diabetes Neuro: Negative; no changes in balance, headaches Skin: Negative; No rashes or skin lesions Psychiatric: Negative; No behavioral problems, depression Sleep: Negative; No snoring, daytime sleepiness, hypersomnolence, bruxism, restless legs, hypnogognic hallucinations, no cataplexy Other comprehensive 14 point system review is negative.  PE BP 112/68   Pulse 70   Ht '4\' 10"'$  (1.473 m)   Wt 133 lb (60.3 kg)   BMI 27.80 kg/m    Wt Readings from Last 3 Encounters:  02/19/16 133 lb (60.3 kg)  02/02/15 141 lb (64 kg)  08/04/14 142 lb (64.4 kg)   General: Alert, oriented, no distress.  Skin: normal turgor, no rashes HEENT: Normocephalic, atraumatic. Pupils round and reactive; sclera anicteric;no lid lag.  Nose without nasal septal  hypertrophy Mouth/Parynx benign; Mallinpatti scale 2/3 Neck: No JVD, no carotid bruits with normal carotid upstroke Chest wall: Nontender to palpation Lungs: clear to ausculatation and percussion; no wheezing or rales Heart: RRR, s1 s2 normal 1/6 systolic murmur. No S3 gallop.  No diastolic murmur.  No rubs thrills or heaves. Abdomen: Mild central adiposity. soft, nontender; no hepatosplenomehaly, BS+; abdominal aorta nontender and not dilated by palpation; no bruits Back: No CVA tenderness Pulses 2+ Extremities: no clubbing cyanosis or edema, Homan's sign negative  Neurologic: grossly nonfocal Psychologic: normal affect and mood.  ECG (independently read by me): Normal sinus rhythm with mild sinus arrhythmia 60 bpm.  Low anterior voltage.  No significant ST segment changes.  Normal intervals.  December 2015 ECG (independently read by me): Normal sinus rhythm with sinus arrhythmia at  73 bpm.  Intervals are normal.  October 2014 ECG: Sinus rhythm with mild sinus arrhythmia. Normal intervals.  LABS:  BMP Latest Ref Rng & Units 10/27/2013 11/22/2009 06/13/2009  Glucose 70 - 99 mg/dL 120(H) 182(H) 94  BUN 6 - 23 mg/dL '10 14 12  '$ Creatinine 0.50 - 1.10 mg/dL 1.20(H) 1.16 1.03  Sodium 137 - 147 mEq/L 144 138 137  Potassium 3.7 - 5.3 mEq/L 4.2 4.3 3.7  Chloride 96 - 112 mEq/L 106 106 104  CO2 19 - 32 mEq/L '25 24 24  '$ Calcium 8.4 - 10.5 mg/dL 9.4 9.1 9.0   Hepatic Function Latest Ref Rng & Units 10/27/2013 11/22/2009  Total Protein 6.0 - 8.3 g/dL 7.2 6.9  Albumin 3.5 - 5.2 g/dL 3.9 3.8  AST 0 - 37 U/L 20 27  ALT 0 - 35 U/L 16 25  Alk Phosphatase 39 - 117 U/L 104 48  Total Bilirubin 0.3 - 1.2 mg/dL 0.4 0.5   CBC Latest Ref Rng & Units 02/02/2015 08/04/2014 01/21/2014  WBC 3.9 - 10.0 10e3/uL 5.5 5.4 5.7  Hemoglobin 11.6 - 15.9 g/dL 14.4 14.0 14.5  Hematocrit 34.8 - 46.6 % 41.8 41.1 42.0  Platelets 145 - 400 10e3/uL 225 226 223   Lab Results  Component Value Date   MCV 94 02/02/2015   MCV 93  08/04/2014   MCV 93 01/21/2014   No results found for: TSH  No results found for: HGBA1C  Lipid Panel  No results found for: CHOL, TRIG, HDL, CHOLHDL, VLDL, LDLCALC, LDLDIRECT    RADIOLOGY: No results found.    ASSESSMENT AND PLAN: Ms. Luv Mish is a 75 year old African-American female who is status post stenting of her RCA In August 2008.  She has been treated medically with reference to her concomitant CAD involving her LAD, and circumflex vessels.  Her last nuclear study in October 2013 continued to show normal perfusion.  Her blood pressure today is stable on Toprol-XL 25 mg in addition to her isosorbide mononitrate.  She is not having any anginal symptoms with reference to her underlying CAD.  Overall, her blood pressure is somewhat low when I repeat by me was 104/64.  She denies any symptoms of dizziness.  She denies any palpitations.  She is on simvastatin 20 mg for hyperlipidemia with target LDL less than 70.  She continues to be on Plavix and aspirin 81 mg and is tolerating this well without bleeding.  She has mild sinus arrhythmia noted on ECG, which is unremarkable and asymptomatic.  Previously she had been on omeprazole for GERD symptoms, but this has been discontinued.  We discussed the potential medication interaction with omeprazole and Plavix.  She is asymptomatic.  I discussed that if recurrent symptoms were developing intermittently she may consider over-the-counter ranitidine or Pepcid.  She reportedly has just had blood work done by Dr. Karlton Lemon.  I will see if I can obtain these results.  I will see her in one year for reevaluation.  Time spent: 25 minutes   Troy Sine, MD, [facility]  02/19/2016 6:23 PM

## 2016-02-19 NOTE — Patient Instructions (Signed)
Your physician wants you to follow-up in: 1 year or sooner if needed. You will receive a reminder letter in the mail two months in advance. If you don't receive a letter, please call our office to schedule the follow-up appointment.   If you need a refill on your cardiac medications before your next appointment, please call your pharmacy.   

## 2016-03-29 ENCOUNTER — Other Ambulatory Visit: Payer: Self-pay | Admitting: Cardiovascular Disease

## 2016-08-19 ENCOUNTER — Emergency Department (HOSPITAL_COMMUNITY)
Admission: EM | Admit: 2016-08-19 | Discharge: 2016-08-19 | Disposition: A | Discharge status: Home / Self Care | Payer: Medicare Other | Attending: Emergency Medicine | Admitting: Emergency Medicine

## 2016-08-19 ENCOUNTER — Emergency Department (HOSPITAL_COMMUNITY): Payer: Medicare Other

## 2016-08-19 DIAGNOSIS — W19XXXA Unspecified fall, initial encounter: Secondary | ICD-10-CM

## 2016-08-19 DIAGNOSIS — W109XXA Fall (on) (from) unspecified stairs and steps, initial encounter: Secondary | ICD-10-CM | POA: Insufficient documentation

## 2016-08-19 DIAGNOSIS — Y939 Activity, unspecified: Secondary | ICD-10-CM | POA: Diagnosis not present

## 2016-08-19 DIAGNOSIS — Y929 Unspecified place or not applicable: Secondary | ICD-10-CM | POA: Diagnosis not present

## 2016-08-19 DIAGNOSIS — I2510 Atherosclerotic heart disease of native coronary artery without angina pectoris: Secondary | ICD-10-CM | POA: Diagnosis not present

## 2016-08-19 DIAGNOSIS — S43014A Anterior dislocation of right humerus, initial encounter: Secondary | ICD-10-CM | POA: Diagnosis not present

## 2016-08-19 DIAGNOSIS — Z91010 Allergy to peanuts: Secondary | ICD-10-CM | POA: Diagnosis not present

## 2016-08-19 DIAGNOSIS — S43005A Unspecified dislocation of left shoulder joint, initial encounter: Secondary | ICD-10-CM | POA: Diagnosis not present

## 2016-08-19 DIAGNOSIS — Z87891 Personal history of nicotine dependence: Secondary | ICD-10-CM | POA: Insufficient documentation

## 2016-08-19 DIAGNOSIS — Z91040 Latex allergy status: Secondary | ICD-10-CM | POA: Insufficient documentation

## 2016-08-19 DIAGNOSIS — Y999 Unspecified external cause status: Secondary | ICD-10-CM | POA: Diagnosis not present

## 2016-08-19 DIAGNOSIS — I10 Essential (primary) hypertension: Secondary | ICD-10-CM | POA: Insufficient documentation

## 2016-08-19 DIAGNOSIS — S42255A Nondisplaced fracture of greater tuberosity of left humerus, initial encounter for closed fracture: Secondary | ICD-10-CM | POA: Insufficient documentation

## 2016-08-19 DIAGNOSIS — S8992XA Unspecified injury of left lower leg, initial encounter: Secondary | ICD-10-CM | POA: Diagnosis present

## 2016-08-19 LAB — CBC WITH DIFFERENTIAL/PLATELET
BASOS PCT: 0 %
Basophils Absolute: 0 10*3/uL (ref 0.0–0.1)
Eosinophils Absolute: 0 10*3/uL (ref 0.0–0.7)
Eosinophils Relative: 0 %
HEMATOCRIT: 43.1 % (ref 36.0–46.0)
HEMOGLOBIN: 14.7 g/dL (ref 12.0–15.0)
LYMPHS ABS: 1.3 10*3/uL (ref 0.7–4.0)
Lymphocytes Relative: 12 %
MCH: 31.9 pg (ref 26.0–34.0)
MCHC: 34.1 g/dL (ref 30.0–36.0)
MCV: 93.5 fL (ref 78.0–100.0)
MONOS PCT: 4 %
Monocytes Absolute: 0.4 10*3/uL (ref 0.1–1.0)
NEUTROS ABS: 9.6 10*3/uL — AB (ref 1.7–7.7)
NEUTROS PCT: 84 %
Platelets: 188 10*3/uL (ref 150–400)
RBC: 4.61 MIL/uL (ref 3.87–5.11)
RDW: 12.9 % (ref 11.5–15.5)
WBC: 11.3 10*3/uL — ABNORMAL HIGH (ref 4.0–10.5)

## 2016-08-19 LAB — BASIC METABOLIC PANEL
Anion gap: 10 (ref 5–15)
BUN: 16 mg/dL (ref 6–20)
CHLORIDE: 108 mmol/L (ref 101–111)
CO2: 21 mmol/L — ABNORMAL LOW (ref 22–32)
CREATININE: 1.14 mg/dL — AB (ref 0.44–1.00)
Calcium: 9.4 mg/dL (ref 8.9–10.3)
GFR calc non Af Amer: 46 mL/min — ABNORMAL LOW (ref >60)
GFR, EST AFRICAN AMERICAN: 53 mL/min — AB (ref >60)
Glucose, Bld: 137 mg/dL — ABNORMAL HIGH (ref 65–99)
Potassium: 4.2 mmol/L (ref 3.5–5.1)
Sodium: 139 mmol/L (ref 135–145)

## 2016-08-19 MED ORDER — FENTANYL CITRATE (PF) 100 MCG/2ML IJ SOLN
50.0000 ug | Freq: Once | INTRAMUSCULAR | Status: DC
  Administered 2016-08-19: 50 ug via INTRAMUSCULAR
  Filled 2016-08-19: qty 2

## 2016-08-19 MED ORDER — FENTANYL CITRATE (PF) 100 MCG/2ML IJ SOLN
75.0000 ug | Freq: Once | INTRAMUSCULAR | Status: AC
  Administered 2016-08-19: 75 ug via INTRAMUSCULAR
  Filled 2016-08-19: qty 2

## 2016-08-19 MED ORDER — LORAZEPAM 2 MG/ML IJ SOLN
0.5000 mg | Freq: Once | INTRAMUSCULAR | Status: AC
  Administered 2016-08-19: 0.5 mg via INTRAMUSCULAR
  Filled 2016-08-19: qty 1

## 2016-08-19 MED ORDER — PROPOFOL 10 MG/ML IV BOLUS
1.0000 mg/kg | Freq: Once | INTRAVENOUS | Status: AC
Start: 2016-08-19 — End: 2016-08-19
  Administered 2016-08-19: 61.2 mg via INTRAVENOUS
  Filled 2016-08-19: qty 20

## 2016-08-19 MED ORDER — FENTANYL CITRATE (PF) 100 MCG/2ML IJ SOLN: 75.0000 ug | Freq: Once | INTRAMUSCULAR | Status: DC

## 2016-08-19 MED ORDER — SODIUM CHLORIDE 0.9 % IV SOLN
INTRAVENOUS | Status: DC
  Administered 2016-08-19: 13:00:00 via INTRAVENOUS

## 2016-08-19 MED ORDER — HYDROCODONE-ACETAMINOPHEN 5-325 MG PO TABS
1.0000 | ORAL_TABLET | ORAL | 0 refills | Status: DC | PRN
Start: 2016-08-19 — End: 2016-08-23

## 2016-08-19 MED ORDER — ONDANSETRON HCL 4 MG/2ML IJ SOLN
4.0000 mg | Freq: Once | INTRAMUSCULAR | Status: AC
Start: 2016-08-19 — End: 2016-08-19
  Administered 2016-08-19: 4 mg via INTRAVENOUS
  Filled 2016-08-19: qty 2

## 2016-08-19 NOTE — ED Notes (Signed)
Pt transported to xray 

## 2016-08-19 NOTE — Sedation Documentation (Signed)
Right shoulder reduced by Dr. Criss AlvineGoldston

## 2016-08-19 NOTE — ED Provider Notes (Signed)
[facility]-EMERGENCY DEPT Provider Note   CSN: [id number] Arrival date & time: 08/19/16  0932  History   Chief Complaint Chief Complaint  Patient presents with  . Fall    HPI Jamie Mejia is a 76 y.o. female.  HPI   LEVEL V caveat- uncooperative   Pt with PMH with ITP, CAD, fatigue due to excessive exertion, hypertension, SOB comes to the ER from home after falling down 10-12 stairs to hardwood floor. She is accompanied by her nephew who did not witness the fall but admits the patient is at baseline.   The patient is uncooperative and difficult to obtain HPI. She tells me she needs to save her energy and to ask somebody else what happened. She also refuses to answer what hurts on her aside from both of her shoulders. She denies that she hit her head or injured her neck. Denies loc. Says that she was fine until after laying on the floor for a few minutes. She is brought to ED on backboard and head blocks  Past Medical History:  Diagnosis Date  . 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 Coronary artery disease   . Fatigue due to excessive exertion   . Hyperlipemia   . Hypertension   . SOB (shortness of breath)     Patient Active Problem List   Diagnosis Date Noted  . CAD (coronary artery disease) 05/19/2013  . Hyperlipidemia with target LDL less than 70 05/19/2013  . HTN (hypertension) 05/19/2013  . 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 coronary artery stenting     stenting to mid right coronary artery at which time was noted  to have concomitant coronary artery disease with 60% to 70% distal narrowing, 50%to 70% circumflex OM2 stenosis, and 50% stenosis in a twin-like LAD segment  . renal duplex scan     left proximal PSV velocity of 196, mid velocity of 197 and distal velocity of 110 suggesting a 1% 10 59% diameter reduction    OB History    No data available     Home  Medications    Prior to Admission medications   Medication Sig Start Date End Date Taking? Authorizing Provider  acetaminophen (TYLENOL) 500 MG tablet Take 1,000 mg by mouth every 6 (six) hours as needed (for pain).    Yes Historical Provider, MD  ALPRAZolam Prudy Feeler) 1 MG tablet Take 0.5 mg by mouth at bedtime as needed for sleep.    Yes Historical Provider, MD  buPROPion (WELLBUTRIN XL) 150 MG 24 hr tablet Take 150 mg by mouth daily.   Yes Historical Provider, MD  calcium-vitamin D (OSCAL WITH D) 500-200 MG-UNIT per tablet Take 1 tablet by mouth daily with breakfast.   Yes Historical Provider, MD  Cholecalciferol (VITAMIN D3) 2000 UNITS TABS Take 2,000 Units by mouth daily.   Yes Historical Provider, MD  clopidogrel (PLAVIX) 75 MG tablet Take 75 mg by mouth daily.   Yes Historical Provider, MD  Cyanocobalamin (VITAMIN B 12 PO) Take 1 tablet by mouth every morning.    Yes Historical Provider, MD  isosorbide mononitrate (IMDUR) 60 MG 24 hr tablet TAKE 1 TABLET (60 MG TOTAL) BY MOUTH DAILY. 03/29/16  Yes Lennette Bihari, MD  loratadine (CLARITIN) 10 MG tablet Take 10 mg by mouth daily as needed for allergies.    Yes Historical Provider, MD  metFORMIN (GLUCOPHAGE) 500 MG tablet Take 250 mg by mouth daily.  Yes Historical Provider, MD  metoprolol succinate (TOPROL-XL) 25 MG 24 hr tablet Take 25 mg by mouth every morning.    Yes Historical Provider, MD  Probiotic Product (ALIGN) 4 MG CAPS Take 4 mg by mouth daily.   Yes Historical Provider, MD  simvastatin (ZOCOR) 20 MG tablet Take 20 mg by mouth daily.   Yes Historical Provider, MD  TURMERIC PO Take 1 capsule by mouth daily.    Yes Historical Provider, MD    Family History Family History  Problem Relation Age of Onset  . Heart disease Mother   . Heart disease Brother     Social History Social History  Substance Use Topics  . Smoking status: Former Smoker    Packs/day: 0.75    Years: 43.00    Types: Cigarettes    Start date: 08/27/1958     Quit date: 04/28/2002  . Smokeless tobacco: Never Used     Comment: quit 14 years ago  . Alcohol use No     Allergies   Latex and Peanut butter flavor   Review of Systems Review of Systems  Review of Systems All other systems negative except as documented in the HPI. All pertinent positives and negatives as reviewed in the HPI.  Physical Exam Updated Vital Signs BP 170/86   Pulse 83   Temp 97.7 F (36.5 C) (Oral)   Resp 12   Wt 61.2 kg   SpO2 100%   BMI 28.22 kg/m   Physical Exam  Constitutional: She appears well-developed and well-nourished. No distress.  HENT:  Head: Normocephalic and atraumatic. Head is without raccoon's eyes, without Battle's sign, without abrasion and without contusion.  Eyes: Pupils are equal, round, and reactive to light.  Neck: Normal range of motion. Neck supple. No spinous process tenderness present. Normal range of motion present.  Cardiovascular: Normal rate and regular rhythm.   Pulmonary/Chest: Effort normal.  Abdominal: Soft.  Musculoskeletal:       Right shoulder: She exhibits decreased range of motion, tenderness, bony tenderness, swelling, deformity and pain.       Left shoulder: She exhibits decreased range of motion, tenderness, bony tenderness, swelling, deformity, pain and spasm.  Pt voluntarily moves legs  Neurological: She is alert.  No abrasions or contusions to scalp or neck Cranial nerves grossly intact on exam. Pt alert and oriented x 3 Upper and lower extremity strength is symmetrical and physiologic Normal muscular tone No facial droop Coordination intact, no limb ataxia,No pronator drift  Skin: Skin is warm and dry.  Nursing note and vitals reviewed.    ED Treatments / Results  Labs (all labs ordered are listed, but only abnormal results are displayed) Labs Reviewed  CBC WITH DIFFERENTIAL/PLATELET - Abnormal; Notable for the following:       Result Value   WBC 11.3 (*)    Neutro Abs 9.6 (*)    All other  components within normal limits  BASIC METABOLIC PANEL - Abnormal; Notable for the following:    CO2 21 (*)    Glucose, Bld 137 (*)    Creatinine, Ser 1.14 (*)    GFR calc non Af Amer 46 (*)    GFR calc Af Amer 53 (*)    All other components within normal limits    EKG  EKG Interpretation None       Radiology Dg Shoulder Right  Result Date: 08/19/2016 CLINICAL DATA:  Right shoulder pain after a fall this morning. EXAM: RIGHT SHOULDER - 2+ VIEW COMPARISON:  None. FINDINGS: There is anterior dislocation of the right humeral head. Osteopenia. No appreciable fracture. IMPRESSION: Anterior dislocation of the proximal right humerus. Electronically Signed   By: Francene BoyersJames  Maxwell M.D.   On: 08/19/2016 11:37   Dg Shoulder Left  Result Date: 08/19/2016 CLINICAL DATA:  Left shoulder pain after falling down stairs today. EXAM: LEFT SHOULDER - 2+ VIEW COMPARISON:  Radiograph dated 06/14/2015 FINDINGS: There is an anterior dislocation of the left humeral head with an impaction avulsion fracture greater tuberosity of the proximal left humerus. Slight AC joint arthropathy. IMPRESSION: Fracture dislocation of the proximal left humerus. Electronically Signed   By: Francene BoyersJames  Maxwell M.D.   On: 08/19/2016 11:36   Dg Shoulder Left Port  Result Date: 08/19/2016 CLINICAL DATA:  Reduction following anterior dislocation EXAM: LEFT SHOULDER - 1 VIEW COMPARISON:  Study obtained earlier in the day. FINDINGS: Frontal view obtained. Previous anterior dislocation has been reduced successfully. There is a large Hill-Sachs type defect. There is a fracture extending from the Hill-Sachs defect medially and distally to the level of the proximal humeral diaphysis on the left. Alignment in this area appears essentially anatomic on this single frontal view. No other fractures are evident. No appreciable arthropathy. IMPRESSION: Reduction of anterior dislocation. Fracture extending from the Hill-Sachs defect on the left laterally  into the proximal medial humeral diaphysis. Alignment in this area is essentially anatomic. No apparent arthropathic change. Electronically Signed   By: Bretta BangWilliam  Woodruff III M.D.   On: 08/19/2016 15:07   Dg Shoulder Right Port  Result Date: 08/19/2016 CLINICAL DATA:  76 year old female with a history of anterior dislocation right humerus EXAM: PORTABLE RIGHT SHOULDER COMPARISON:  08/19/2016 FINDINGS: Single AP view. Single AP view demonstrates right humeral head projecting over the glenoid. Although the subacromial interval is decreased compared to the prior, location of the humeral head cannot be confirmed on single view. No fracture line identified. IMPRESSION: Post reduction view of the right shoulder demonstrates decreased subacromial interval compared to the prior suggesting successful relocation, however, location of the humeral head not completely assessed with a single anterior view. A more sensitive plain film evaluation would include a scapular Y view. Signed, Yvone NeuJaime S. Loreta AveWagner, DO [facility] Electronically Signed   By: Gilmer MorJaime  Wagner D.O.   On: 08/19/2016 15:08    Procedures Procedures (including critical care time)  Medications Ordered in ED Medications  0.9 %  sodium chloride infusion ( Intravenous New Bag/Given 08/19/16 1250)  ondansetron (ZOFRAN) injection 4 mg (4 mg Intravenous Given 08/19/16 1248)  fentaNYL (SUBLIMAZE) injection 75 mcg (75 mcg Intramuscular Given 08/19/16 1249)  LORazepam (ATIVAN) injection 0.5 mg (0.5 mg Intramuscular Given 08/19/16 1055)  propofol (DIPRIVAN) 10 mg/mL bolus/IV push 61.2 mg (61.2 mg Intravenous Given by Other 08/19/16 1407)     Initial Impression / Assessment and Plan / ED Course  I have reviewed the triage vital signs and the nursing notes.  Pertinent labs & imaging results that were available during my care of the patient were reviewed by me and considered in my medical decision making  (see chart for details).  10:00am Infront of nephew patient refuses any imaging aside from her two shoulders. Refuses x 3 head and neck CT- nephew says she is at baseline and she is alert and oriented  She refuses pelvic xray and chest 1 view. She understands that other things may be going on but she only wants her shoulders looked at, no blood work, and says the  nurses, techs and myself do not have her best interest at heart trying to start on IV and go on a search expedition ordering all of these xray and unnecessary tests. -- Dr. Criss Alvine aware of situation.  I spoke with Dr. Eulah Pont, T. --  He reviewed xrays and feels comfortable with reduction of bilateral shoulders in the ED. Recommends sling for the left shoulder and to leave the right shoulder out of the shoulder sling.  3:52 pm- successful relocation of bilateral shoulders. Case Manager has seen patient, offered face to face visit. Home Health will be arranged for home OT, RN, PT. Patient lives alone but has family members who are more than happy to stay with here and this has been arranged.  Shared visit with Dr. Criss Alvine who will put in angiocath, reduced the shoulder, an pushed the propofol. Please refer to his note for procedural notes.  Final Clinical Impressions(s) / ED Diagnoses   Final diagnoses:  Dislocation of left shoulder joint, initial encounter  Anterior shoulder dislocation, right, initial encounter  Closed nondisplaced fracture of greater tuberosity of left humerus, initial encounter  Fall, initial encounter    New Prescriptions New Prescriptions   No medications on file     Marlon Pel, PA-C 08/19/16 1555    Marlon Pel, PA-C 08/19/16 1556    Pricilla Loveless, MD 08/23/16 2227

## 2016-08-19 NOTE — ED Triage Notes (Signed)
Per EMS - pt from home. Pt was standing, fell down 10-12 steps from landing to hardwood floor. Denies LOC, neck/back/pelvic pain while at rest; however, pain in bilateral shoulders w/ palpation and arm movement. Placed on LSB and head blocks.

## 2016-08-19 NOTE — ED Notes (Signed)
Pt refuses to straighten arms, holding arms near head. Pt tearful in hallway bed, stating "she would have been better off at home" and "she isn't being treated like a human." Uncooperative for exam w/ Tiffany, PA. Moved pt to A7, pt still complaining "she has not been treated right." Pt screaming upon gentle palpation of shoulders. Attempted IV access x 2 on pt. Pt screaming at staff, uncooperative w/ straightening arm to start IV. Family member at bedside now, attempting to console pt.

## 2016-08-19 NOTE — Sedation Documentation (Signed)
Dr. Criss AlvineGoldston attempting to reduce left shoulder

## 2016-08-19 NOTE — Care Management Note (Signed)
Case Management Note  Patient Details  Name: Jamie Mejia MRN: [id number] Date of Birth: Jul 09, 1941  Subjective/Objective:                  From home alone. /75 yo pt with PMH with ITP, CAD, fatigue due to excessive exertion, hypertension, SOB comes to the ER from home after falling down 10-12 stairs to hardwood floor.   Action/Plan: Follow for disposition needs.   Expected Discharge Date:  08/19/16               Expected Discharge Plan:  Home w Home Health Services  In-House Referral:  NA  Discharge planning Services  CM Consult  Post Acute Care Choice:  Home Health Choice offered to:  Patient  DME Arranged:  N/A DME Agency:     HH Arranged:  RN, PT, OT [facility]  Status of Service:  Completed, signed off  If discussed at Long Length of Stay Meetings, dates discussed:    Additional Comments: [name] J. [name], RN, BSN, [employer] [phone] Spoke with pt at bedside regarding [facility]. Offered pt list of home health agencies to choose from.  Pt chose [facility] to render services. [name], RN of [facility] notified.  No DME needs identified at this time.  [name], [name], RN 08/19/2016, 3:57 PM

## 2016-08-19 NOTE — Sedation Documentation (Signed)
Left shoulder sling applied by Dr. Criss AlvineGoldston

## 2016-08-19 NOTE — ED Provider Notes (Signed)
Angiocath insertion Performed by: Pricilla LovelessGOLDSTON, Farzana Koci T  Consent: Verbal consent obtained. Risks and benefits: risks, benefits and alternatives were discussed Time out: Immediately prior to procedure a "time out" was called to verify the correct patient, procedure, equipment, support staff and site/side marked as required.  Preparation: Patient was prepped and draped in the usual sterile fashion.  Vein Location: right basilic  Ultrasound Guided  Gauge: 20  Normal blood return and flush without difficulty Patient tolerance: Patient tolerated the procedure well with no immediate complications.  Procedural sedation Performed by: Pricilla LovelessGOLDSTON, Isaura Schiller T Consent: Verbal consent obtained. Risks and benefits: risks, benefits and alternatives were discussed Required items: required blood products, implants, devices, and special equipment available Patient identity confirmed: arm band and provided demographic data Time out: Immediately prior to procedure a "time out" was called to verify the correct patient, procedure, equipment, support staff and site/side marked as required.  Sedation type: moderate (conscious) sedation NPO time confirmed and considedered  Sedatives: PROPOFOL  Physician Time at Bedside: 15 minutes  Vitals: Vital signs were monitored during sedation. Cardiac Monitor, pulse oximeter Patient tolerance: Patient tolerated the procedure well with no immediate complications. Comments: Pt with uneventful recovered. Returned to pre-procedural sedation baseline  Reduction of dislocation Date/Time: 08/19/16 Performed by: Pricilla LovelessGOLDSTON, Zykee Avakian T Authorized by: Pricilla LovelessGOLDSTON, Param Capri T Consent: Verbal consent obtained. Risks and benefits: risks, benefits and alternatives were discussed Consent given by: patient Required items: required blood products, implants, devices, and special equipment available Time out: Immediately prior to procedure a "time out" was called to verify the correct patient,  procedure, equipment, support staff and site/side marked as required.  Patient sedated: propofol  Vitals: Vital signs were monitored during sedation. Patient tolerance: Patient tolerated the procedure well with no immediate complications. Joint: left shoulder Reduction technique: traction/countertraction   Reduction of dislocation Date/Time:08/19/16 Performed by: Audree CamelGOLDSTON, Keiosha Cancro T Authorized by: Pricilla LovelessGOLDSTON, Nikola Marone T Consent: Verbal consent obtained. Risks and benefits: risks, benefits and alternatives were discussed Consent given by: patient Required items: required blood products, implants, devices, and special equipment available Time out: Immediately prior to procedure a "time out" was called to verify the correct patient, procedure, equipment, support staff and site/side marked as required.  Patient sedated: propofol  Vitals: Vital signs were monitored during sedation. Patient tolerance: Patient tolerated the procedure well with no immediate complications. Joint: right shoulder Reduction technique: traction/countertraction        Pricilla LovelessScott Meng [place], MD 08/19/16 [id number]

## 2016-08-19 NOTE — Sedation Documentation (Signed)
Left shoulder reduced by Elmarie Shileyiffany, PA

## 2016-08-23 ENCOUNTER — Encounter (HOSPITAL_COMMUNITY): Payer: Self-pay | Admitting: Emergency Medicine

## 2016-08-23 ENCOUNTER — Emergency Department (HOSPITAL_COMMUNITY): Payer: Medicare Other

## 2016-08-23 ENCOUNTER — Emergency Department (HOSPITAL_COMMUNITY)
Admission: EM | Admit: 2016-08-23 | Discharge: 2016-08-23 | Disposition: A | Discharge status: Home / Self Care | Payer: Medicare Other | Attending: Emergency Medicine | Admitting: Emergency Medicine

## 2016-08-23 DIAGNOSIS — X58XXXA Exposure to other specified factors, initial encounter: Secondary | ICD-10-CM | POA: Diagnosis not present

## 2016-08-23 DIAGNOSIS — I10 Essential (primary) hypertension: Secondary | ICD-10-CM | POA: Diagnosis not present

## 2016-08-23 DIAGNOSIS — M24411 Recurrent dislocation, right shoulder: Secondary | ICD-10-CM | POA: Diagnosis not present

## 2016-08-23 DIAGNOSIS — M79601 Pain in right arm: Secondary | ICD-10-CM | POA: Diagnosis present

## 2016-08-23 DIAGNOSIS — Z79899 Other long term (current) drug therapy: Secondary | ICD-10-CM | POA: Insufficient documentation

## 2016-08-23 DIAGNOSIS — Z91040 Latex allergy status: Secondary | ICD-10-CM | POA: Insufficient documentation

## 2016-08-23 DIAGNOSIS — Y9389 Activity, other specified: Secondary | ICD-10-CM | POA: Diagnosis not present

## 2016-08-23 DIAGNOSIS — Y999 Unspecified external cause status: Secondary | ICD-10-CM | POA: Diagnosis not present

## 2016-08-23 DIAGNOSIS — Z955 Presence of coronary angioplasty implant and graft: Secondary | ICD-10-CM | POA: Insufficient documentation

## 2016-08-23 DIAGNOSIS — Y929 Unspecified place or not applicable: Secondary | ICD-10-CM | POA: Diagnosis not present

## 2016-08-23 DIAGNOSIS — Z91010 Allergy to peanuts: Secondary | ICD-10-CM | POA: Diagnosis not present

## 2016-08-23 DIAGNOSIS — I2510 Atherosclerotic heart disease of native coronary artery without angina pectoris: Secondary | ICD-10-CM | POA: Diagnosis not present

## 2016-08-23 DIAGNOSIS — Z87891 Personal history of nicotine dependence: Secondary | ICD-10-CM | POA: Diagnosis not present

## 2016-08-23 DIAGNOSIS — Z7984 Long term (current) use of oral hypoglycemic drugs: Secondary | ICD-10-CM | POA: Insufficient documentation

## 2016-08-23 MED ORDER — PROPOFOL 10 MG/ML IV BOLUS
0.5000 mg/kg | Freq: Once | INTRAVENOUS | Status: AC
  Administered 2016-08-23: 30 mg via INTRAVENOUS
  Filled 2016-08-23: qty 20

## 2016-08-23 MED ORDER — SODIUM CHLORIDE 0.9 % IV SOLN
INTRAVENOUS | Status: DC
  Administered 2016-08-23: 14:00:00 via INTRAVENOUS

## 2016-08-23 MED ORDER — HYDROMORPHONE HCL 2 MG/ML IJ SOLN
0.5000 mg | Freq: Once | INTRAMUSCULAR | Status: AC
  Administered 2016-08-23: 0.5 mg via INTRAVENOUS
  Filled 2016-08-23: qty 1

## 2016-08-23 MED ORDER — ACETAMINOPHEN 500 MG PO TABS
1000.0000 mg | ORAL_TABLET | Freq: Once | ORAL | Status: AC
  Administered 2016-08-23: 1000 mg via ORAL
  Filled 2016-08-23: qty 2

## 2016-08-23 NOTE — Sedation Documentation (Addendum)
ED Provider at bedside discussing possible placement of pt into facility for PT.

## 2016-08-23 NOTE — Care Management (Signed)
ED CM met with patient and family at bedside. Patient introduced family members at bedside and consented to  discuss care in their presence. Patient demands to go home, changes her mind about [facility]. She states she will find someone to stay at home with her. Patient and family still requesting information regarding Rehab facilities in case it does not work with home plan. CM explained that we can order a HH SW to assist with placement should she change her mind, patient is agreeable.  Patient was also given a list of private duty agencies as well.  Patient and family are agreeable with care transition plan. Updated Dr. Vallery Ridge and Cricket RN on [place].  No further CM needs identified.

## 2016-08-23 NOTE — ED Provider Notes (Signed)
[facility]-EMERGENCY DEPT Provider Note   CSN: [id number] Arrival date & time: 08/23/16  1324  By signing my name below, I, Sonum Patel, attest that this documentation has been prepared under the direction and in the presence of Raeford RazorStephen Mikaela Hilgeman, MD. Electronically Signed: Sonum Patel, Neurosurgeoncribe. 08/23/16. 1:45 PM.  History   Chief Complaint No chief complaint on file.  The history is provided by the patient. No language interpreter was used.     HPI Comments: Jamie Mejia is a 76 y.o. female who presents to the Emergency Department complaining of constant, unchanged right arm pain that began earlier today. She states she raised her right arm to complete a task and when she lowered her arm she began to have pain and difficulty completely lowering that extremity. She denies associated numbness or paresthesia to the affected extremity. She was seen on 08/19/16 at which point she had bilateral shoulder dislocations that were reduced in the ED.   Past Medical History:  Diagnosis Date  . 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 Coronary artery disease   . Fatigue due to excessive exertion   . Hyperlipemia   . Hypertension   . SOB (shortness of breath)     Patient Active Problem List   Diagnosis Date Noted  . CAD (coronary artery disease) 05/19/2013  . Hyperlipidemia with target LDL less than 70 05/19/2013  . HTN (hypertension) 05/19/2013  . 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 coronary artery stenting     stenting to mid right coronary artery at which time was noted  to have concomitant coronary artery disease with 60% to 70% distal narrowing, 50%to 70% circumflex OM2 stenosis, and 50% stenosis in a twin-like LAD segment  . renal duplex scan     left proximal PSV velocity of 196, mid velocity of 197 and distal velocity of 110 suggesting a 1% 10 59% diameter reduction    OB History    No  data available       Home Medications    Prior to Admission medications   Medication Sig Start Date End Date Taking? Authorizing Provider  acetaminophen (TYLENOL) 500 MG tablet Take 1,000 mg by mouth every 6 (six) hours as needed (for pain).     Historical Provider, MD  ALPRAZolam Prudy Feeler(XANAX) 1 MG tablet Take 0.5 mg by mouth at bedtime as needed for sleep.     Historical Provider, MD  buPROPion (WELLBUTRIN XL) 150 MG 24 hr tablet Take 150 mg by mouth daily.    Historical Provider, MD  calcium-vitamin D (OSCAL WITH D) 500-200 MG-UNIT per tablet Take 1 tablet by mouth daily with breakfast.    Historical Provider, MD  Cholecalciferol (VITAMIN D3) 2000 UNITS TABS Take 2,000 Units by mouth daily.    Historical Provider, MD  clopidogrel (PLAVIX) 75 MG tablet Take 75 mg by mouth daily.    Historical Provider, MD  Cyanocobalamin (VITAMIN B 12 PO) Take 1 tablet by mouth every morning.     Historical Provider, MD  HYDROcodone-acetaminophen (NORCO/VICODIN) 5-325 MG tablet Take 1-2 tablets by mouth every 4 (four) hours as needed. 08/19/16   Tiffany Neva SeatGreene, PA-C  isosorbide mononitrate (IMDUR) 60 MG 24 hr tablet TAKE 1 TABLET (60 MG TOTAL) BY MOUTH DAILY. 03/29/16   Lennette Biharihomas A Kelly, MD  loratadine (CLARITIN) 10 MG tablet Take 10 mg by mouth daily as needed for allergies.     Historical Provider, MD  metFORMIN (GLUCOPHAGE)  500 MG tablet Take 250 mg by mouth daily.    Historical Provider, MD  metoprolol succinate (TOPROL-XL) 25 MG 24 hr tablet Take 25 mg by mouth every morning.     Historical Provider, MD  Probiotic Product (ALIGN) 4 MG CAPS Take 4 mg by mouth daily.    Historical Provider, MD  simvastatin (ZOCOR) 20 MG tablet Take 20 mg by mouth daily.    Historical Provider, MD  TURMERIC PO Take 1 capsule by mouth daily.     Historical Provider, MD    Family History Family History  Problem Relation Age of Onset  . Heart disease Mother   . Heart disease Brother     Social History Social History    Substance Use Topics  . Smoking status: Former Smoker    Packs/day: 0.75    Years: 43.00    Types: Cigarettes    Start date: 08/27/1958    Quit date: 04/28/2002  . Smokeless tobacco: Never Used     Comment: quit 14 years ago  . Alcohol use No     Allergies   Latex and Peanut butter flavor   Review of Systems Review of Systems  A complete 10 system review of systems was obtained and all systems are negative except as noted in the HPI and PMH.    Physical Exam Updated Vital Signs BP 139/75 (BP Location: Left Arm)   Pulse 70   Temp 98 F (36.7 C) (Oral)   Resp 18   Wt 135 lb (61.2 kg)   SpO2 95%   BMI 28.22 kg/m   Physical Exam  Constitutional: She is oriented to person, place, and time. She appears well-developed and well-nourished. No distress.  HENT:  Head: Normocephalic and atraumatic.  Eyes: EOM are normal.  Neck: Normal range of motion.  Cardiovascular: Normal rate, regular rhythm and normal heart sounds.   Pulmonary/Chest: Effort normal and breath sounds normal.  Abdominal: Soft. She exhibits no distension. There is no tenderness.  Musculoskeletal: She exhibits deformity.  Palpable deformity to proximal right humerus. Cannot passively range secondary to severe pain. Closed injury. NVI  Neurological: She is alert and oriented to person, place, and time. No sensory deficit.  Skin: Skin is warm and dry.  Psychiatric: She has a normal mood and affect. Judgment normal.  Nursing note and vitals reviewed.    ED Treatments / Results  DIAGNOSTIC STUDIES: Oxygen Saturation is 95% on RA, adequate by my interpretation.    COORDINATION OF CARE: 1:43 PM Discussed treatment plan with pt at bedside and pt agreed to plan.   Labs (all labs ordered are listed, but only abnormal results are displayed) Labs Reviewed - No data to display  EKG  EKG Interpretation None       Radiology Dg Shoulder Right  Result Date: 08/23/2016 CLINICAL DATA:  Right shoulder pain.  Recent dislocation. Initial encounter. EXAM: RIGHT SHOULDER - 2+ VIEW COMPARISON:  08/19/2016 FINDINGS: There is recurrent anterior dislocation of the humeral head relative to the glenoid. No acute fracture is identified. Soft tissues are unremarkable. IMPRESSION: Recurrent anterior glenohumeral dislocation. Electronically Signed   By: Sebastian Ache M.D.   On: 08/23/2016 14:34    Procedures Procedures (including critical care time)  REDUCTION PROCEDURE NOTE: Patient identification was confirmed, consent was obtained verbally and arm band.  This procedure was performed at 1:45 PM by Raeford Razor, MD Site R shoulder Anesthetic used (type and amt): n/a Pre-procedure N/V exam: NVI # of attempts: 1 Type of splint:  sling Pt anesthetized, fx/dislocation reduced successfully.  Patient tolerated procedure well without complications.  Patient splinted. Post-procedure exam indicates patient is n/v intact distal to the injury site.  Post-procedure films show excellent alignment.  Patient returned to baseline prior to disposition.  Instructions for care discussed verbally and patient provided with additional written instructions for homecare and f/u.   Procedural Sedation:  Preprocedure  Pre-anesthesia/induction confirmation of laterality/correct procedure site including "time-out." Provider confirms review of the nurses' note, allergies, medications, pertinent labs, PMH, pre-induction vital signs, pulse oximetry, pain level, and ECG (as applicable), and patient condition satisfactory for commencing with order for sedation and procedure.   Medications: Propofol, 30mg  IV  Patient tolerated procedure and procedural sedation component as expected without apparent immediate complications.  Physician confirms procedural medication orders as administered, patient was assessed by physician post-procedure, and confirms post-sedation plan of care and disposition.  Total time of sedation/monitoring: 35  minutes  Medications Ordered in ED Medications - No data to display   Initial Impression / Assessment and Plan / ED Course  I have reviewed the triage vital signs and the nursing notes.  Pertinent labs & imaging results that were available during my care of the patient were reviewed by me and considered in my medical decision making (see chart for details).     75yF with recurrent shoulder dislocation. She wasn't wearing her sling. Will reduce.   Dislocation was reduced. Placed in sling. Patient's niece is at bedside and has concerns about patient's ability to take care of herself at home. Patient lives at home alone normally. With her recent bilateral shoulder dislocations she really requires a lot of assistance because she should not be using either arm to do significant activities. Family has been staying with her the past couple days, but unfortunately they cannot continue to do so. I feel she would benefit from placement in rehab facility. Pt seems agreeable to this. Will have social work evaluate.   Final Clinical Impressions(s) / ED Diagnoses   Final diagnoses:  Recurrent shoulder dislocation, right    New Prescriptions New Prescriptions   No medications on file   I personally preformed the services scribed in my presence. The recorded information has been reviewed is accurate. Raeford Razor, MD.    Raeford Razor, MD 08/23/16 [phone]

## 2016-08-23 NOTE — ED Notes (Signed)
Pt stable, family at bedside, states understanding of discharge instructions 

## 2016-08-23 NOTE — ED Notes (Signed)
Pt upset that she is still in the ED without knowing when she gets to leave. RN discussed with pt that we are waiting on social work. Pt states she needs food and drink, and denies wanting food/drink at this time when RN offers. Pt states she will let RN know if she changes her mind.

## 2016-08-23 NOTE — ED Notes (Signed)
Patient transported to X-ray 

## 2016-08-23 NOTE — ED Notes (Signed)
Case Management at bedside, PTAR to get home

## 2016-08-23 NOTE — Care Management (Signed)
[facility] Patient has returned today with increased pain with difficulty moving arm. Patient does not have the support at home and may benefit from SNF for Rehab patient is agreeable. CSW will follow up

## 2016-08-23 NOTE — ED Triage Notes (Signed)
To ED via [facility] from home, with c/o severe right arm pain, had bilateral dislocated shoulders on Monday-- today was reaching up to do hair and felt pain. shoudlder appears deformed, good pulses distal, good sensation.

## 2016-08-24 ENCOUNTER — Telehealth: Payer: Self-pay | Admitting: *Deleted

## 2016-08-24 NOTE — Telephone Encounter (Signed)
Called and left ED CSW Fernande BoydenJoyce Smyre message to follow up on this pt and return call with alcohol and drug treatment resources @ [phone]. No further CM needs at this time.

## 2016-11-28 IMAGING — CR DG CHEST 2V
2 series · 2 of 2 positions shown · non-contrast
Comparison: October 27, 2013

CLINICAL DATA: Patient eating piece of ham ; feels meat stuck in
throat region

EXAM:
CHEST  2 VIEW

[w chest pa]
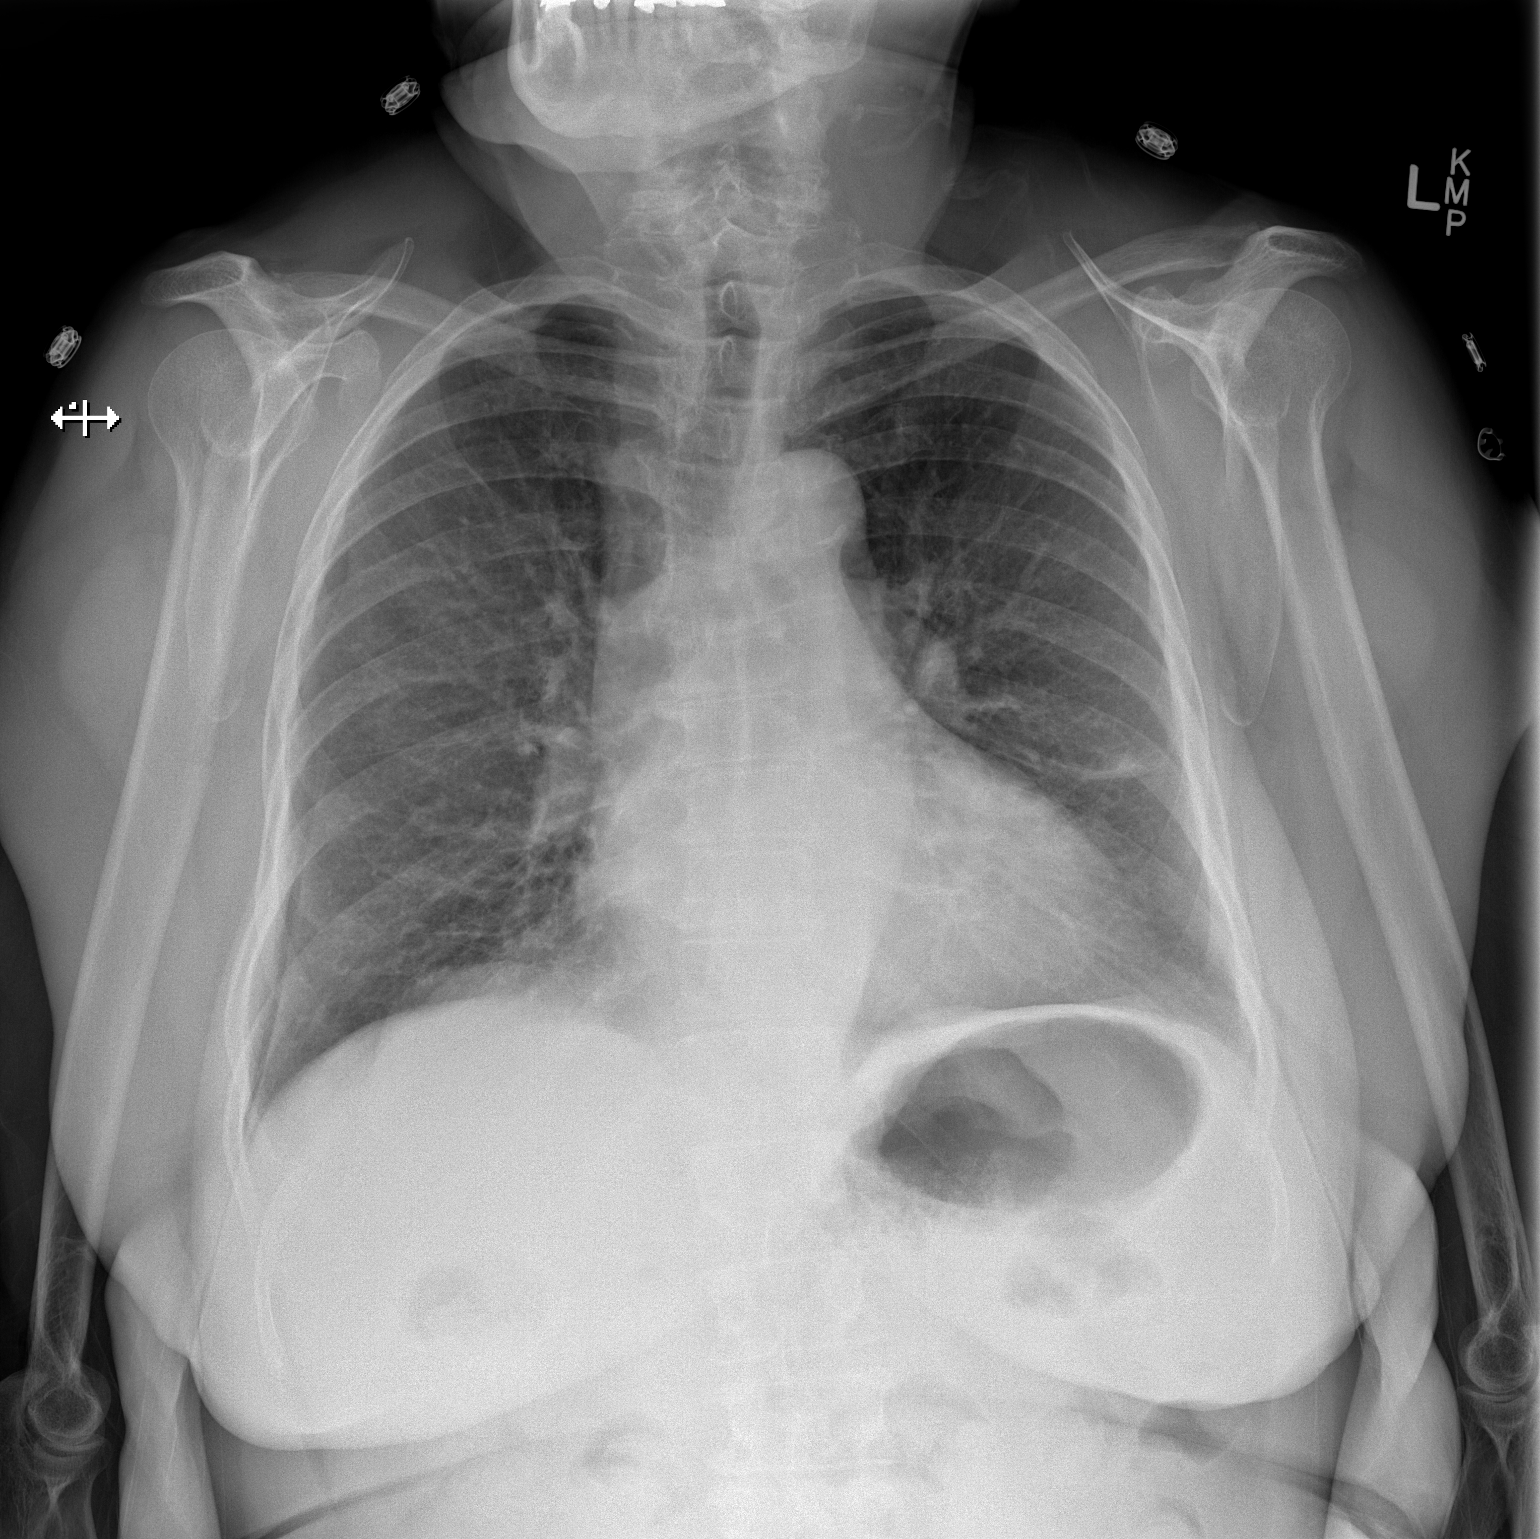

[w chest lat]
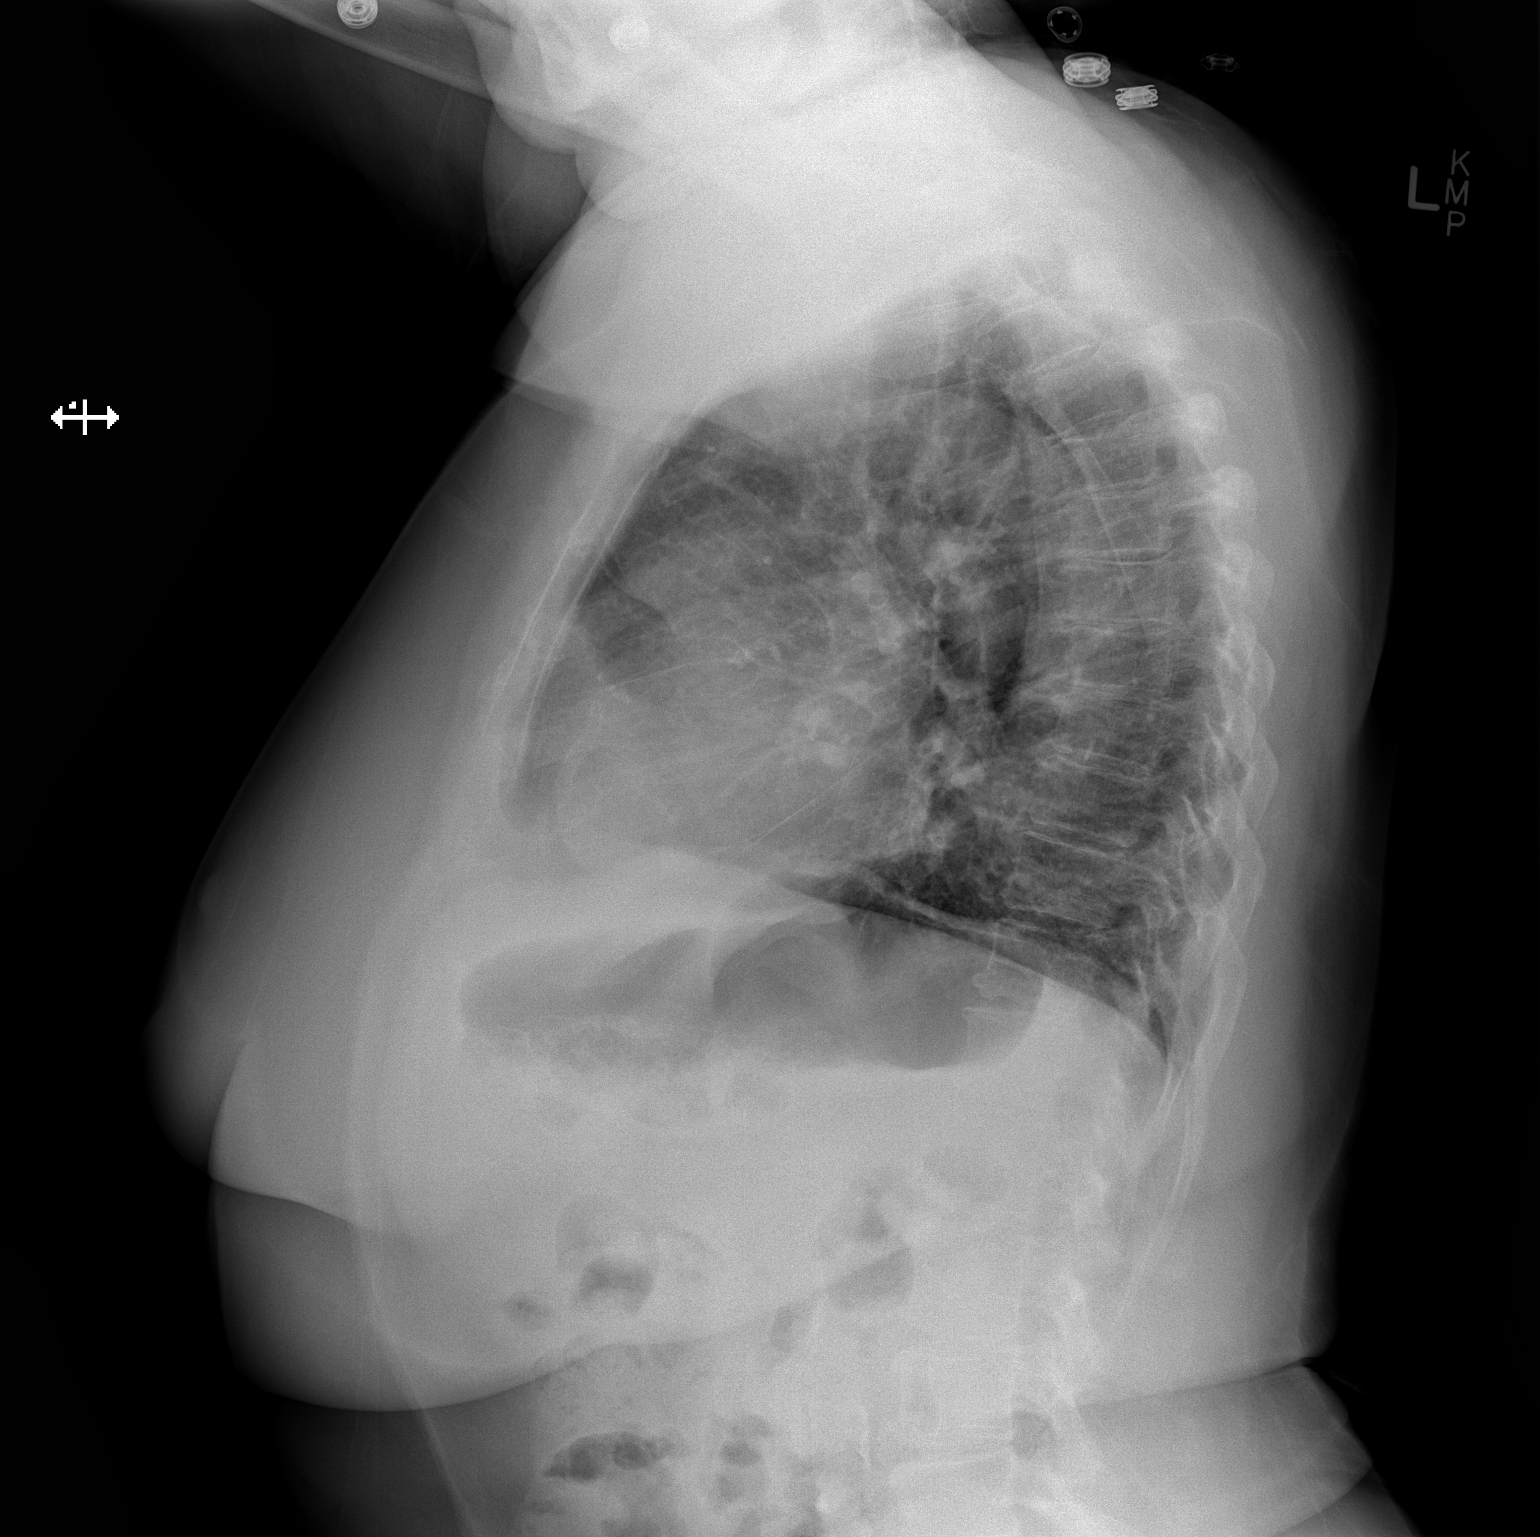

[2 of 2 positions shown; findings below may reference images not displayed]

FINDINGS: There is mild atelectatic change in the left mid lung. The lungs are
otherwise clear. Heart size and pulmonary vascularity are normal. No
adenopathy. No radiopaque foreign body is seen.
IMPRESSION: Mild atelectasis left mid lung. No radiopaque foreign body
appreciable by radiography.

## 2017-02-18 ENCOUNTER — Other Ambulatory Visit: Payer: Self-pay | Admitting: Cardiovascular Disease

## 2017-04-01 ENCOUNTER — Other Ambulatory Visit: Payer: Self-pay | Admitting: Cardiovascular Disease

## 2017-04-30 ENCOUNTER — Ambulatory Visit (INDEPENDENT_AMBULATORY_CARE_PROVIDER_SITE_OTHER): Payer: Medicare Other | Admitting: Cardiovascular Disease

## 2017-04-30 ENCOUNTER — Encounter: Payer: Self-pay | Admitting: Cardiovascular Disease

## 2017-04-30 VITALS — BP 132/82 | HR 68 | Ht 58.5 in | Wt 137.4 lb

## 2017-04-30 DIAGNOSIS — I2510 Atherosclerotic heart disease of native coronary artery without angina pectoris: Secondary | ICD-10-CM

## 2017-04-30 DIAGNOSIS — E785 Hyperlipidemia, unspecified: Secondary | ICD-10-CM | POA: Diagnosis not present

## 2017-04-30 DIAGNOSIS — K219 Gastro-esophageal reflux disease without esophagitis: Secondary | ICD-10-CM | POA: Diagnosis not present

## 2017-04-30 DIAGNOSIS — I10 Essential (primary) hypertension: Secondary | ICD-10-CM | POA: Diagnosis not present

## 2017-04-30 MED ORDER — ISOSORBIDE MONONITRATE ER 60 MG PO TB24: ORAL_TABLET | ORAL | 3 refills | Status: DC

## 2017-04-30 MED ORDER — CLOPIDOGREL BISULFATE 75 MG PO TABS: 75.0000 mg | ORAL_TABLET | Freq: Every day | ORAL | 3 refills | Status: DC

## 2017-04-30 MED ORDER — SIMVASTATIN 20 MG PO TABS: 20.0000 mg | ORAL_TABLET | Freq: Every day | ORAL | 3 refills | Status: DC

## 2017-04-30 MED ORDER — METOPROLOL SUCCINATE ER 25 MG PO TB24: 25.0000 mg | ORAL_TABLET | Freq: Every day | ORAL | 3 refills | Status: DC

## 2017-04-30 NOTE — Patient Instructions (Signed)
Medication Instructions:  Your physician recommends that you continue on your current medications as directed. Please refer to the Current Medication list given to you today.  Follow-Up: Your physician wants you to follow-up in: 12 MONTHS with Dr. Kelly. You will receive a reminder letter in the mail two months in advance. If you don't receive a letter, please call our office to schedule the follow-up appointment.   Any Other Special Instructions Will Be Listed Below (If Applicable).     If you need a refill on your cardiac medications before your next appointment, please call your pharmacy.   

## 2017-04-30 NOTE — Progress Notes (Signed)
Patient ID: Jamie Mejia, female   DOB: 04-Sep-1940, 76 y.o.   MRN: [id number]     Primary M.D.: Dr. Willey Blade  HPI: Jamie Mejia is a 76 y.o. female who presents to the office for a 15 month follow-up cardiology evaluation.  Jamie Mejia has established coronary artery disease and underwent stenting of her proximal RCA in August 2008.  She had concomitant CAD with 60-70% distal narrowing, 50-70% circumflex O2 stenosis, 50% stenosis in the stomach LAD vessel. She did have coronary calcification involving her left main and LAD system. She underwent a two-year followup nuclear perfusion study in October 2013 which continued to be stable without scar or ischemia. She had hyperdynamic LV function.  Additional problems include hypertension, hyperlipidemia, as well as mild renal artery stenosis by duplex imaging. She was felt to have narrowing in the 1-59% range in her left renal artery with a proximal PSV of 196.  Jamie Mejia sees Dr. Willey Blade at 3 month intervals. She states laboratory has been checked recently.  Her husband unfortunately died in October 01, 2014.  She is still having difficulty with this.  When I last saw her in July 2017.  She denied any  recurrent anginal symptoms.  She was taking Toprol-XL 25 mg and isosorbide 60 mg daily.  She denies any dizziness or palpitations.  She was diabetic on metformin.  She had hyperlipidemia and and was taking simvastatin 20 mg.  She had issues with GERD and I suggested over-the-counter Zantac.  Over the past 15 months, she continues to feel well and is without anginal symptomatology.  She tells me she had a fall in January 2018 where she may have tripped and apparently fractured her shoulder and had a dislocation.  Her GERD has been controlled with Zantac.  She's no longer taking baby aspirin which was stopped by Dr. Lutricia Feil.  She continues to take Plavix.  She denies any recurrent anginal symptoms.  She is unaware of any  palpitations.  She denies PND, orthopnea.  She denies dyspnea.  She apparently is no longer taking metformin.  She presents for reevaluation.   Past Medical History:  Diagnosis Date  . 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 Coronary artery disease   . Fatigue due to excessive exertion   . Hyperlipemia   . Hypertension   . SOB (shortness of breath)     Past Surgical History:  Procedure Laterality Date  . coronary artery stenting     stenting to mid right coronary artery at which time was noted  to have concomitant coronary artery disease with 60% to 70% distal narrowing, 50%to 70% circumflex OM2 stenosis, and 50% stenosis in a twin-like LAD segment  . renal duplex scan     left proximal PSV velocity of 196, mid velocity of 197 and distal velocity of 110 suggesting a 1% 10 59% diameter reduction    Allergies  Allergen Reactions  . Latex Itching  . Other Hives and Other (See Comments)    Pt is allergic to peanut butter.     Current Outpatient Prescriptions  Medication Sig Dispense Refill  . acetaminophen (TYLENOL) 500 MG tablet Take 1,000 mg by mouth every 6 (six) hours as needed for mild pain, moderate pain or headache.     . ALPRAZolam (XANAX) 1 MG tablet Take 0.5 mg by mouth at bedtime as needed for sleep.     Marland Kitchen buPROPion (WELLBUTRIN XL) 150 MG 24 hr tablet Take 150 mg by mouth daily.    Marland Kitchen  Calcium Carbonate-Vitamin D (CALCIUM 600+D) 600-400 MG-UNIT tablet Take 1 tablet by mouth daily.    . Cholecalciferol (D3-1000 PO) Take 1 capsule by mouth daily.    . clopidogrel (PLAVIX) 75 MG tablet Take 1 tablet (75 mg total) by mouth daily. 90 tablet 3  . isosorbide mononitrate (IMDUR) 60 MG 24 hr tablet TAKE 1 TABLET (60 MG TOTAL) BY MOUTH DAILY. 90 tablet 3  . loratadine (CLARITIN) 10 MG tablet Take 10 mg by mouth daily as needed for allergies.     . metoprolol succinate (TOPROL-XL) 25 MG 24 hr tablet Take 1 tablet (25 mg total) by mouth daily. 90 tablet 3  .  Probiotic Product (ALIGN) 4 MG CAPS Take 4 mg by mouth daily.    . simvastatin (ZOCOR) 20 MG tablet Take 1 tablet (20 mg total) by mouth at bedtime. 90 tablet 3  . TURMERIC PO Take 1 capsule by mouth daily.     . vitamin B-12 (CYANOCOBALAMIN) 1000 MCG tablet Take 1,000 mcg by mouth daily.     No current facility-administered medications for this visit.     Social History   Social History  . Marital status: Married    Spouse name: N/A  . Number of children: N/A  . Years of education: N/A   Occupational History  . Not on file.   Social History Main Topics  . Smoking status: Former Smoker    Packs/day: 0.75    Years: 43.00    Types: Cigarettes    Start date: 08/27/1958    Quit date: 04/28/2002  . Smokeless tobacco: Never Used     Comment: quit 14 years ago  . Alcohol use No  . Drug use: Unknown  . Sexual activity: Not on file   Other Topics Concern  . Not on file   Social History Narrative  . No narrative on file    Family History  Problem Relation Age of Onset  . Heart disease Mother   . Heart disease Brother    Additional social history is notable that she is widowed since February 2016.  She does not routinely exercise. There is no tobacco or alcohol use.  ROS General: Negative; No fevers, chills, or night sweats;  HEENT: Negative; No changes in vision or hearing, sinus congestion, difficulty swallowing Pulmonary: Negative; No cough, wheezing, shortness of breath, hemoptysis Cardiovascular: See history of present illness GI: Positive for GERD GU: Negative; No dysuria, hematuria, or difficulty voiding Musculoskeletal: Spinal stenosis with intermittent leg weakness Hematologic/Oncology: Remote history of chronic ITP; no easy bruising, bleeding Endocrine: Negative; no heat/cold intolerance; no diabetes Neuro: Negative; no changes in balance, headaches Skin: Negative; No rashes or skin lesions Psychiatric: Negative; No behavioral problems, depression Sleep:  Negative; No snoring, daytime sleepiness, hypersomnolence, bruxism, restless legs, hypnogognic hallucinations, no cataplexy Other comprehensive 14 point system review is negative.  PE BP 132/82   Pulse 68   Ht 4' 10.5" (1.486 m)   Wt 137 lb 6.4 oz (62.3 kg)   BMI 28.23 kg/m    Repeat blood pressure by me 138/82  Wt Readings from Last 3 Encounters:  04/30/17 137 lb 6.4 oz (62.3 kg)  08/23/16 135 lb (61.2 kg)  08/19/16 135 lb (61.2 kg)    General: Alert, oriented, no distress.  Skin: normal turgor, no rashes, warm and dry HEENT: Normocephalic, atraumatic. Pupils equal round and reactive to light; sclera anicteric; extraocular muscles intact;  Nose without nasal septal hypertrophy Mouth/Parynx benign; Mallinpatti scale 3 Neck: No JVD, no  carotid bruits; normal carotid upstroke Lungs: clear to ausculatation and percussion; no wheezing or rales Chest wall: without tenderness to palpitation Heart: PMI not displaced, RRR, s1 s2 normal, 1/6 systolic murmur, no diastolic murmur, no rubs, gallops, thrills, or heaves Abdomen: soft, nontender; no hepatosplenomehaly, BS+; abdominal aorta nontender and not dilated by palpation. Back: no CVA tenderness Pulses 2+ Musculoskeletal: full range of motion, normal strength, no joint deformities Extremities: no clubbing cyanosis or edema, Homan's sign negative  Neurologic: grossly nonfocal; Cranial nerves grossly wnl Psychologic: Normal mood and affect   ECG (independently read by me): Normal sinus rhythm at 60 bpm.  Nonspecific T changes.  Normal intervals.  July 2017 ECG (independently read by me): Normal sinus rhythm with mild sinus arrhythmia 60 bpm.  Low anterior voltage.  No significant ST segment changes.  Normal intervals.  December 2015 ECG (independently read by me): Normal sinus rhythm with sinus arrhythmia at 73 bpm.  Intervals are normal.  October 2014 ECG: Sinus rhythm with mild sinus arrhythmia. Normal intervals.  LABS:  BMP  Latest Ref Rng & Units 08/19/2016 10/27/2013 11/22/2009  Glucose 65 - 99 mg/dL 137(H) 120(H) 182(H)  BUN 6 - 20 mg/dL '16 10 14  '$ Creatinine 0.44 - 1.00 mg/dL 1.14(H) 1.20(H) 1.16  Sodium 135 - 145 mmol/L 139 144 138  Potassium 3.5 - 5.1 mmol/L 4.2 4.2 4.3  Chloride 101 - 111 mmol/L 108 106 106  CO2 22 - 32 mmol/L 21(L) 25 24  Calcium 8.9 - 10.3 mg/dL 9.4 9.4 9.1   Hepatic Function Latest Ref Rng & Units 10/27/2013 11/22/2009  Total Protein 6.0 - 8.3 g/dL 7.2 6.9  Albumin 3.5 - 5.2 g/dL 3.9 3.8  AST 0 - 37 U/L 20 27  ALT 0 - 35 U/L 16 25  Alk Phosphatase 39 - 117 U/L 104 48  Total Bilirubin 0.3 - 1.2 mg/dL 0.4 0.5   CBC Latest Ref Rng & Units 08/19/2016 02/02/2015 08/04/2014  WBC 4.0 - 10.5 K/uL 11.3(H) 5.5 5.4  Hemoglobin 12.0 - 15.0 g/dL 14.7 14.4 14.0  Hematocrit 36.0 - 46.0 % 43.1 41.8 41.1  Platelets 150 - 400 K/uL 188 225 226   Lab Results  Component Value Date   MCV 93.5 08/19/2016   MCV 94 02/02/2015   MCV 93 08/04/2014   No results found for: TSH  No results found for: HGBA1C  Lipid Panel  No results found for: CHOL, TRIG, HDL, CHOLHDL, VLDL, LDLCALC, LDLDIRECT    RADIOLOGY: No results found.  IMPRESSION:  1. Coronary artery disease involving native coronary artery of native heart without angina pectoris   2. Essential hypertension   3. Hyperlipidemia with target LDL less than 70   4. GERD without esophagitis     ASSESSMENT AND PLAN: Jamie Mejia is a 76 year old African-American female who is status post stenting of her RCA In August 2008.  She has been treated medically with reference to her concomitant CAD involving her LAD, and circumflex vessels.  Her last nuclear study in October 2013 continued to show normal perfusion.  When I last saw her, her blood pressure was on the low side home regimen of Toprol and isosorbide.  Her blood pressure today has increased to 132/82 and on repeat by me was 138/84.  I've suggested that she monitor this closely.  I discussed  with her the new hypertensive guidelines.  She is not had any interim anginal symptoms on her low-dose beta blocker and nitrate therapy.  She continues to take simvastatin  20 mg for hyperlipidemia.  Target LDL is less than 70 in this patient with CAD.  She continues to be on clopidogrel.  She also has been on vitamin B12.  She is on Wellbutrin per Dr. Karlton Lemon.  She tells me she will be undergoing a complete set of blood work with her primary physician shortly.  As result, I will not check labs today.  She appears to be stable cardiovascularly.  She denies bleeding.  She denies any presyncope or syncope and her fall was not due to lightheadedness but she tripped.  I will see her in one year for reevaluation. Time spent: 25 minutes   Troy Sine, MD, [facility]  04/30/2017 11:04 AM

## 2017-04-30 NOTE — Addendum Note (Signed)
Addended by: Chana Bode on: 04/30/2017 03:47 PM   Modules accepted: Orders

## 2018-03-11 ENCOUNTER — Other Ambulatory Visit: Payer: Self-pay | Admitting: Internal Medicine

## 2018-03-11 ENCOUNTER — Ambulatory Visit
Admission: RE | Admit: 2018-03-11 | Discharge: 2018-03-11 | Disposition: A | Discharge status: Home / Self Care | Payer: Medicare Other | Source: Ambulatory Visit | Attending: Internal Medicine | Admitting: Internal Medicine

## 2018-03-11 DIAGNOSIS — M25551 Pain in right hip: Secondary | ICD-10-CM

## 2018-04-27 ENCOUNTER — Other Ambulatory Visit: Payer: Self-pay | Admitting: Cardiovascular Disease

## 2018-05-18 ENCOUNTER — Other Ambulatory Visit: Payer: Self-pay | Admitting: Cardiovascular Disease

## 2018-05-25 ENCOUNTER — Ambulatory Visit: Payer: Medicare Other | Admitting: Cardiovascular Disease

## 2018-05-25 ENCOUNTER — Encounter: Payer: Self-pay | Admitting: Cardiovascular Disease

## 2018-05-25 VITALS — BP 110/70 | HR 68 | Ht 59.0 in | Wt 137.0 lb

## 2018-05-25 DIAGNOSIS — I2510 Atherosclerotic heart disease of native coronary artery without angina pectoris: Secondary | ICD-10-CM

## 2018-05-25 DIAGNOSIS — E785 Hyperlipidemia, unspecified: Secondary | ICD-10-CM

## 2018-05-25 DIAGNOSIS — I10 Essential (primary) hypertension: Secondary | ICD-10-CM | POA: Diagnosis not present

## 2018-05-25 DIAGNOSIS — K219 Gastro-esophageal reflux disease without esophagitis: Secondary | ICD-10-CM | POA: Diagnosis not present

## 2018-05-25 MED ORDER — FAMOTIDINE 20 MG PO TABS: 20.0000 mg | ORAL_TABLET | Freq: Two times a day (BID) | ORAL | 3 refills | Status: AC

## 2018-05-25 NOTE — Progress Notes (Signed)
Patient ID: Jamie Mejia, female   DOB: 1940/11/25, 77 y.o.   MRN: [id number]     Primary M.D.: Dr. Willey Blade  HPI: Jamie Mejia is a 77 y.o. female who presents to the office for a 12 month follow-up cardiology evaluation.  Jamie Mejia has established coronary artery disease and underwent stenting of her proximal RCA in August 2008.  She had concomitant CAD with 60-70% distal narrowing, 50-70% circumflex O2 stenosis, 50% stenosis in the stomach LAD vessel. She did have coronary calcification involving her left main and LAD system. She underwent a two-year followup nuclear perfusion study in October 2013 which continued to be stable without scar or ischemia. She had hyperdynamic LV function.  Additional problems include hypertension, hyperlipidemia, as well as mild renal artery stenosis by duplex imaging. She was felt to have narrowing in the 1-59% range in her left renal artery with a proximal PSV of 196.  Jamie Mejia sees Dr. Willey Blade at 3 month intervals. She states laboratory has been checked recently.  Her husband unfortunately died in Oct 20, 2014.  She is still having difficulty with this.  When I last saw her in July 2017.  She denied any  recurrent anginal symptoms.  She was taking Toprol-XL 25 mg and isosorbide 60 mg daily.  She denies any dizziness or palpitations.  She was diabetic on metformin.  She had hyperlipidemia and and was taking simvastatin 20 mg.  She had issues with GERD and I suggested over-the-counter Zantac.  I last saw her in October 2018 at which time she was doing well and denied any anginal symptomatology..  She had a fall in January 2018 where she may have tripped and apparently fractured her shoulder and had a dislocation.   Over the past year, she has continued to do well.  She has GERD which in the past had been controlled with Zantac.  However, due to the recent recall for generic over-the-counter Zantac she stopped taking this and was  questioning what she should take presently.  She will be seeing her primary physician Dr. Heath Gold in November for complete laboratory.  She denies recurrent angina or palpitations, PND orthopnea.  She presents for evaluation  Past Medical History:  Diagnosis Date  . 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 Coronary artery disease   . Fatigue due to excessive exertion   . Hyperlipemia   . Hypertension   . SOB (shortness of breath)     Past Surgical History:  Procedure Laterality Date  . coronary artery stenting     stenting to mid right coronary artery at which time was noted  to have concomitant coronary artery disease with 60% to 70% distal narrowing, 50%to 70% circumflex OM2 stenosis, and 50% stenosis in a twin-like LAD segment  . renal duplex scan     left proximal PSV velocity of 196, mid velocity of 197 and distal velocity of 110 suggesting a 1% 10 59% diameter reduction    Allergies  Allergen Reactions  . Latex Itching  . Other Hives and Other (See Comments)    Pt is allergic to peanut butter.     Current Outpatient Medications  Medication Sig Dispense Refill  . acetaminophen (TYLENOL) 500 MG tablet Take 1,000 mg by mouth every 6 (six) hours as needed for mild pain, moderate pain or headache.     . ALPRAZolam (XANAX) 1 MG tablet Take 0.5 mg by mouth at bedtime as needed for sleep.     Marland Kitchen  B Complex-C (B-COMPLEX WITH VITAMIN C) tablet Take 1 tablet by mouth daily.    Marland Kitchen buPROPion (WELLBUTRIN XL) 150 MG 24 hr tablet Take 150 mg by mouth daily.    . Calcium Carbonate-Vitamin D (CALCIUM 600+D) 600-400 MG-UNIT tablet Take 1 tablet by mouth daily.    . Cholecalciferol (D3-1000 PO) Take 1 capsule by mouth daily.    . clopidogrel (PLAVIX) 75 MG tablet Take 1 tablet (75 mg total) by mouth daily. 90 tablet 3  . Coenzyme Q10 (CO Q-10) 400 MG CAPS Take by mouth.    . isosorbide mononitrate (IMDUR) 60 MG 24 hr tablet TAKE 1 TABLET BY MOUTH EVERY DAY 90 tablet 0  .  loratadine (CLARITIN) 10 MG tablet Take 10 mg by mouth daily as needed for allergies.     . Magnesium-Zinc (MAGNESIUM-CHELATED ZINC PO) Take by mouth.    . metoprolol succinate (TOPROL-XL) 25 MG 24 hr tablet Take 1 tablet (25 mg total) by mouth daily. 90 tablet 3  . Probiotic Product (ALIGN) 4 MG CAPS Take 4 mg by mouth daily.    . simvastatin (ZOCOR) 20 MG tablet TAKE 1 TABLET BY MOUTH EVERYDAY AT BEDTIME 90 tablet 3  . TURMERIC PO Take 1 capsule by mouth daily.     . famotidine (PEPCID) 20 MG tablet Take 1 tablet (20 mg total) by mouth 2 (two) times daily. OTC 90 tablet 3   No current facility-administered medications for this visit.     Social History   Socioeconomic History  . Marital status: Married    Spouse name: Not on file  . Number of children: Not on file  . Years of education: Not on file  . Highest education level: Not on file  Occupational History  . Not on file  Social Needs  . Financial resource strain: Not on file  . Food insecurity:    Worry: Not on file    Inability: Not on file  . Transportation needs:    Medical: Not on file    Non-medical: Not on file  Tobacco Use  . Smoking status: Former Smoker    Packs/day: 0.75    Years: 43.00    Pack years: 32.25    Types: Cigarettes    Start date: 08/27/1958    Last attempt to quit: 04/28/2002    Years since quitting: 16.0  . Smokeless tobacco: Never Used  . Tobacco comment: quit 14 years ago  Substance and Sexual Activity  . Alcohol use: No    Alcohol/week: 0.0 standard drinks  . Drug use: Not on file  . Sexual activity: Not on file  Lifestyle  . Physical activity:    Days per week: Not on file    Minutes per session: Not on file  . Stress: Not on file  Relationships  . Social connections:    Talks on phone: Not on file    Gets together: Not on file    Attends religious service: Not on file    Active member of club or organization: Not on file    Attends meetings of clubs or organizations: Not on file     Relationship status: Not on file  . Intimate partner violence:    Fear of current or ex partner: Not on file    Emotionally abused: Not on file    Physically abused: Not on file    Forced sexual activity: Not on file  Other Topics Concern  . Not on file  Social History Narrative  .  Not on file    Family History  Problem Relation Age of Onset  . Heart disease Mother   . Heart disease Brother    Additional social history is notable that she is widowed since February 2016.  She does not routinely exercise. There is no tobacco or alcohol use.  ROS General: Negative; No fevers, chills, or night sweats;  HEENT: Negative; No changes in vision or hearing, sinus congestion, difficulty swallowing Pulmonary: Negative; No cough, wheezing, shortness of breath, hemoptysis Cardiovascular: See history of present illness GI: Positive for GERD GU: Negative; No dysuria, hematuria, or difficulty voiding Musculoskeletal: Spinal stenosis with intermittent leg weakness Hematologic/Oncology: Remote history of chronic ITP; no easy bruising, bleeding Endocrine: Negative; no heat/cold intolerance; no diabetes Neuro: Negative; no changes in balance, headaches Skin: Negative; No rashes or skin lesions Psychiatric: Negative; No behavioral problems, depression Sleep: Negative; No snoring, daytime sleepiness, hypersomnolence, bruxism, restless legs, hypnogognic hallucinations, no cataplexy Other comprehensive 14 point system review is negative.  PE BP 110/70   Pulse 68   Ht 4' 11" (1.499 m)   Wt 137 lb (62.1 kg)   BMI 27.67 kg/m    Repeat blood pressure by me was 120/64  Wt Readings from Last 3 Encounters:  05/25/18 137 lb (62.1 kg)  04/30/17 137 lb 6.4 oz (62.3 kg)  08/23/16 135 lb (61.2 kg)   General: Alert, oriented, no distress.  Skin: normal turgor, no rashes, warm and dry HEENT: Normocephalic, atraumatic. Pupils equal round and reactive to light; sclera anicteric; extraocular muscles  intact;  Nose without nasal septal hypertrophy Mouth/Parynx benign; Mallinpatti scale 3 Neck: No JVD, no carotid bruits; normal carotid upstroke Lungs: clear to ausculatation and percussion; no wheezing or rales Chest wall: without tenderness to palpitation Heart: PMI not displaced, RRR, s1 s2 normal, 1/6 systolic murmur, no diastolic murmur, no rubs, gallops, thrills, or heaves Abdomen: soft, nontender; no hepatosplenomehaly, BS+; abdominal aorta nontender and not dilated by palpation. Back: no CVA tenderness Pulses 2+ Musculoskeletal: full range of motion, normal strength, no joint deformities Extremities: no clubbing cyanosis or edema, Homan's sign negative  Neurologic: grossly nonfocal; Cranial nerves grossly wnl Psychologic: Normal mood and affect   ECG (independently read by me): Normal sinus rhythm at 68 bpm.  Isolated PAC.  Nonspecific T changes.  Low voltage.  October 2018 ECG (independently read by me): Normal sinus rhythm at 60 bpm.  Nonspecific T changes.  Normal intervals.  July 2017 ECG (independently read by me): Normal sinus rhythm with mild sinus arrhythmia 60 bpm.  Low anterior voltage.  No significant ST segment changes.  Normal intervals.  December 2015 ECG (independently read by me): Normal sinus rhythm with sinus arrhythmia at 73 bpm.  Intervals are normal.  October 2014 ECG: Sinus rhythm with mild sinus arrhythmia. Normal intervals.  LABS:  BMP Latest Ref Rng & Units 08/19/2016 10/27/2013 11/22/2009  Glucose 65 - 99 mg/dL 137(H) 120(H) 182(H)  BUN 6 - 20 mg/dL _0 Creatinine 0.44 - 1.00 mg/dL 1.14(H) 1.20(H) 1.16  Sodium 135 - 145 mmol/L 139 144 138  Potassium 3.5 - 5.1 mmol/L 4.2 4.2 4.3  Chloride 101 - 111 mmol/L 108 106 106  CO2 22 - 32 mmol/L 21(L) 25 24  Calcium 8.9 - 10.3 mg/dL 9.4 9.4 9.1   Hepatic Function Latest Ref Rng & Units 10/27/2013 11/22/2009  Total Protein 6.0 - 8.3 g/dL 7.2 6.9  Albumin 3.5 - 5.2 g/dL 3.9 3.8  AST 0 - 37 U/L 20 27  ALT  0 - 35 U/L 16 25  Alk Phosphatase 39 - 117 U/L 104 48  Total Bilirubin 0.3 - 1.2 mg/dL 0.4 0.5   CBC Latest Ref Rng & Units 08/19/2016 02/02/2015 08/04/2014  WBC 4.0 - 10.5 K/uL 11.3(H) 5.5 5.4  Hemoglobin 12.0 - 15.0 g/dL 14.7 14.4 14.0  Hematocrit 36.0 - 46.0 % 43.1 41.8 41.1  Platelets 150 - 400 K/uL 188 225 226   Lab Results  Component Value Date   MCV 93.5 08/19/2016   MCV 94 02/02/2015   MCV 93 08/04/2014   No results found for: TSH  No results found for: HGBA1C  Lipid Panel  No results found for: CHOL, TRIG, HDL, CHOLHDL, VLDL, LDLCALC, LDLDIRECT    RADIOLOGY: No results found.  IMPRESSION:  1. Coronary artery disease involving native coronary artery of native heart without angina pectoris   2. Essential hypertension   3. Hyperlipidemia with target LDL less than 70   4. GERD without esophagitis     ASSESSMENT AND PLAN: Jamie Mejia is a 77 year-old African-American female who is status post stenting of her RCA In August 2008.  She has been treated medically with reference to her concomitant CAD involving her LAD, and circumflex vessels.  Her last nuclear study in October 2013 continued to show normal perfusion.  I reviewed recent laboratory from her primary physician in May 2019.  An NMR LipoProfile revealed total cholesterol 175, triglycerides 159, LDL 98.  She had 1408 LDL particles with 8 21 small LDL particles.  HbA1c was 6.2.  Currently she is on simvastatin 20 mg.  She will be having repeat lab work next week.  If LDL is still elevated and > 70 I would suggest changing simvastatin to atorvastatin 40 mg or rosuvastatin 20 mg for more aggressive LDL lowering.  Since her GERD had been controlled with Zantac I have suggested she try over-the-counter Pepcid 20 mg.  She continues to be on isosorbide and Toprol and is not having anginal symptoms.  She is on Wellbutrin for depression.  I will see her in 1 year for reevaluation or sooner if problems arise.  Time spent: 25  minutes Troy Sine, MD, [facility]  05/27/2018 7:38 PM

## 2018-05-25 NOTE — Patient Instructions (Signed)
Medication Instructions:  Start over the counter Pepcid 20 mg daily  If you need a refill on your cardiac medications before your next appointment, please call your pharmacy.   Follow-Up: At [facility], you and your health needs are our priority.  As part of our continuing mission to provide you with exceptional heart care, we have created designated Provider Care Teams.  These Care Teams include your primary Cardiologist (physician) and Advanced Practice Providers (APPs -  Physician Assistants and Nurse Practitioners) who all work together to provide you with the care you need, when you need it. You will need a follow up appointment in 12 months.  Please call our office 2 months in advance to schedule this appointment.  You may see Dr. Tresa Endo or one of the following Advanced Practice Providers on your designated Care Team: [place], [place] . Micah Flesher, PA-C  Any Other Special Instructions Will Be Listed Below (If Applicable).

## 2018-05-27 ENCOUNTER — Encounter: Payer: Self-pay | Admitting: Cardiovascular Disease

## 2018-07-01 ENCOUNTER — Other Ambulatory Visit: Payer: Self-pay | Admitting: Cardiovascular Disease

## 2018-07-03 ENCOUNTER — Other Ambulatory Visit: Payer: Self-pay

## 2018-07-03 MED ORDER — CLOPIDOGREL BISULFATE 75 MG PO TABS: 75.0000 mg | ORAL_TABLET | Freq: Every day | ORAL | 3 refills | Status: DC

## 2018-08-26 IMAGING — DX DG SHOULDER 2+V*R*
2 series · 2 of 2 positions shown · non-contrast
Comparison: 08/19/2016

CLINICAL DATA: Right shoulder pain. Recent dislocation. Initial
encounter.

EXAM:
RIGHT SHOULDER - 2+ VIEW

[x shoulder ap right]
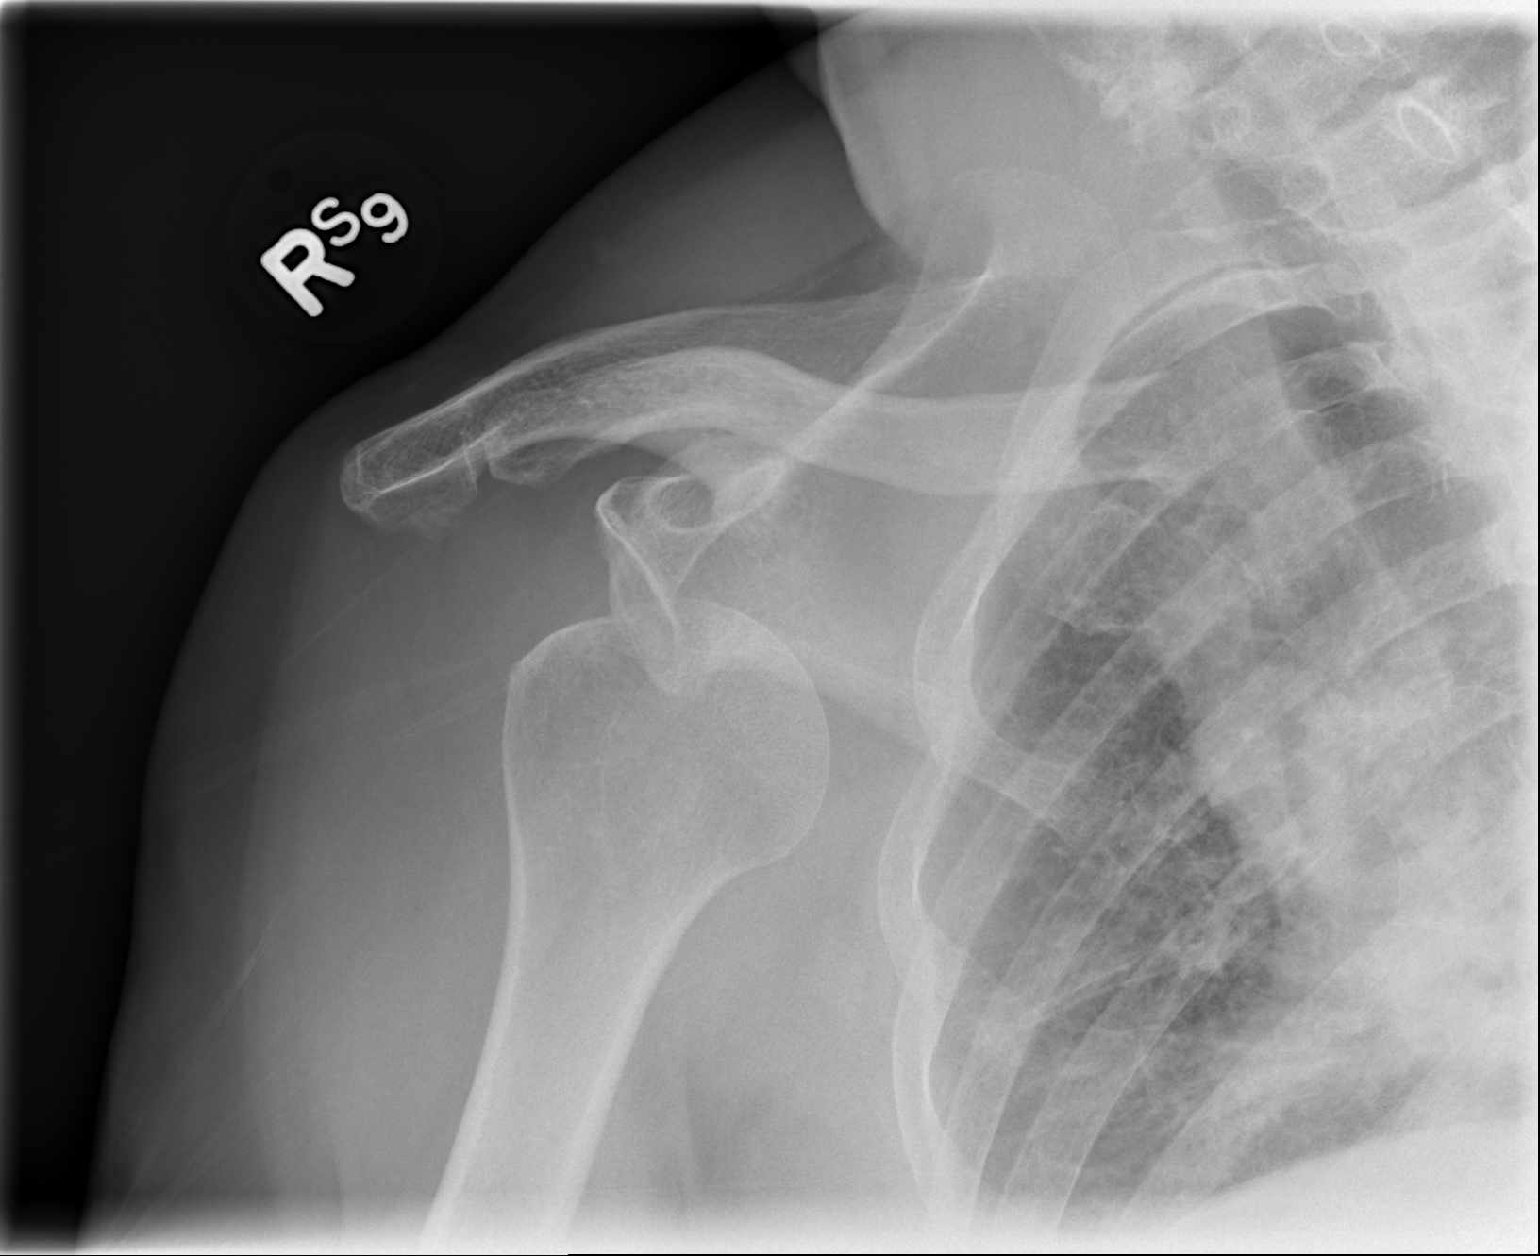

[x scapula y-view right]
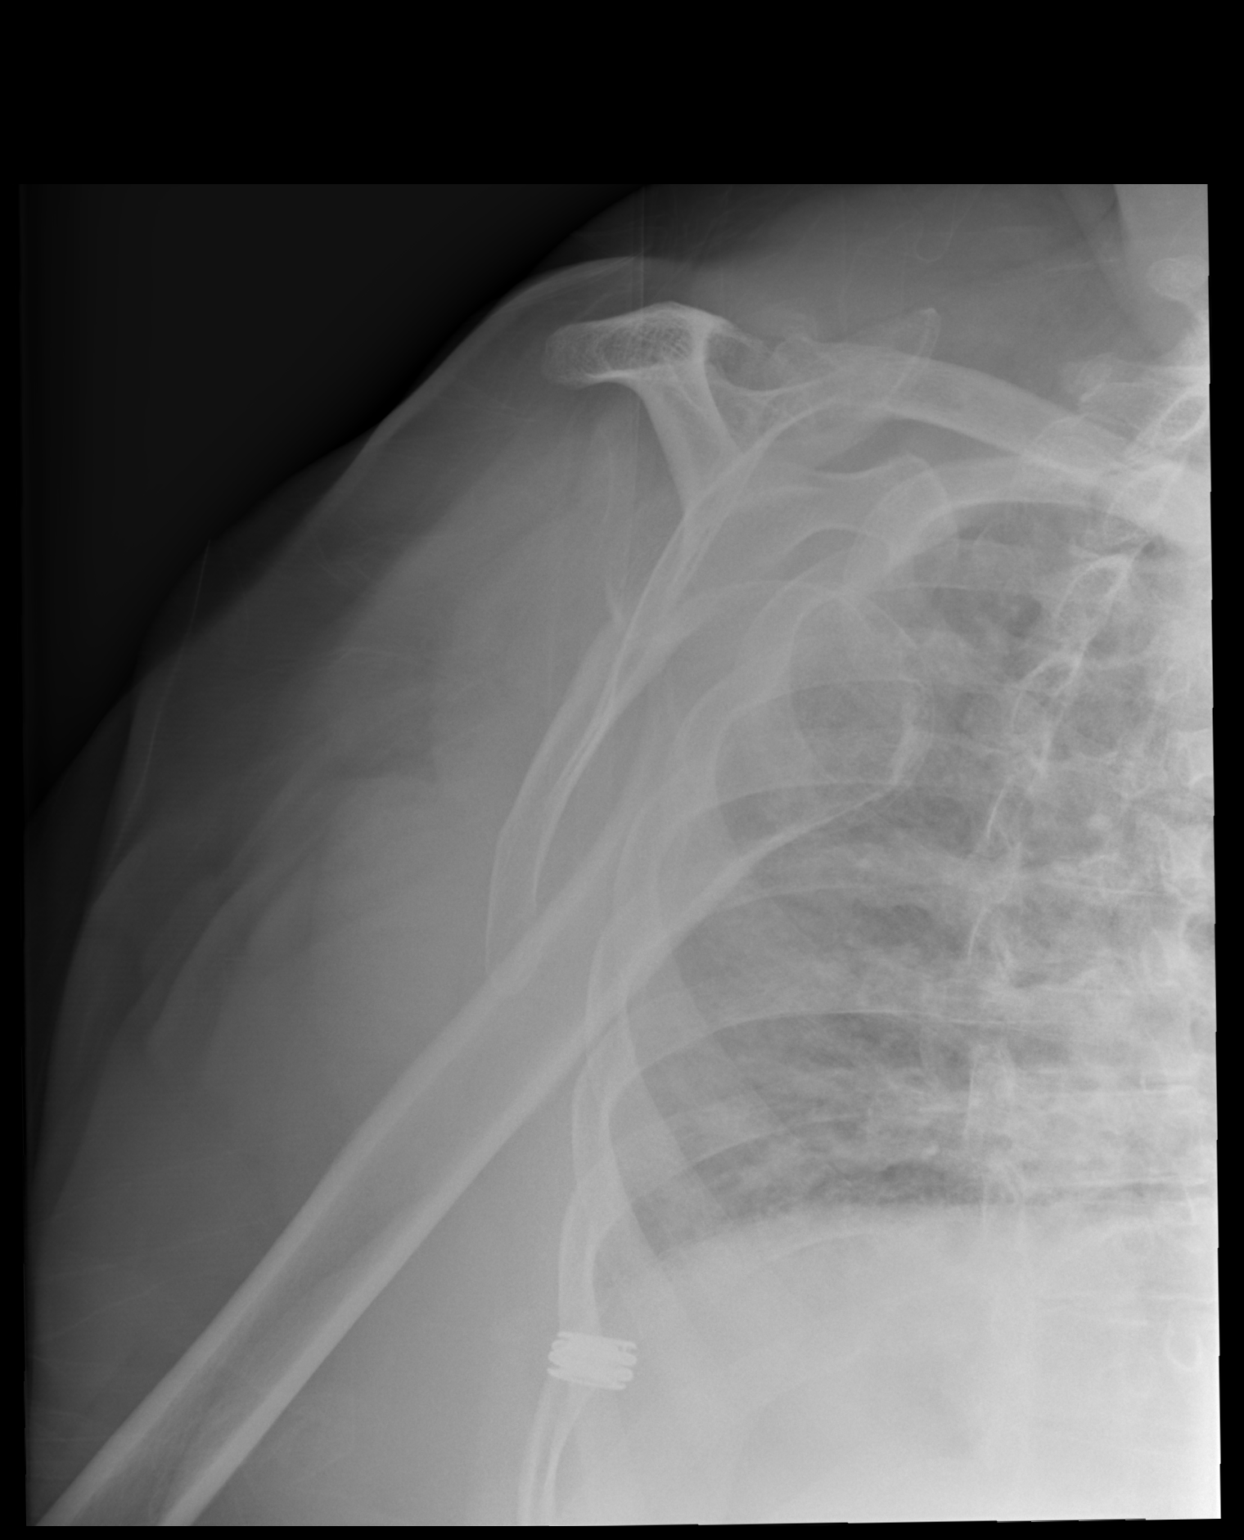

[2 of 2 positions shown; findings below may reference images not displayed]

FINDINGS: There is recurrent anterior dislocation of the humeral head relative
to the glenoid. No acute fracture is identified. Soft tissues are
unremarkable.
IMPRESSION: Recurrent anterior glenohumeral dislocation.

## 2019-03-10 ENCOUNTER — Other Ambulatory Visit: Payer: Self-pay

## 2019-03-10 ENCOUNTER — Ambulatory Visit (HOSPITAL_COMMUNITY)
Admission: EM | Admit: 2019-03-10 | Discharge: 2019-03-10 | Disposition: A | Discharge status: Home / Self Care | Payer: Medicare Other

## 2019-03-10 NOTE — ED Notes (Signed)
Jamie Mejia, patient access reports patient registered and then said she was leaving.  No explanation.

## 2019-05-27 ENCOUNTER — Other Ambulatory Visit: Payer: Self-pay | Admitting: Cardiovascular Disease

## 2019-07-12 ENCOUNTER — Other Ambulatory Visit: Payer: Self-pay | Admitting: Cardiovascular Disease

## 2019-08-02 ENCOUNTER — Other Ambulatory Visit: Payer: Self-pay

## 2019-08-02 ENCOUNTER — Ambulatory Visit: Payer: Medicare PPO | Admitting: Cardiovascular Disease

## 2019-08-02 ENCOUNTER — Encounter: Payer: Self-pay | Admitting: Cardiovascular Disease

## 2019-08-27 ENCOUNTER — Telehealth (INDEPENDENT_AMBULATORY_CARE_PROVIDER_SITE_OTHER): Payer: Medicare PPO | Admitting: Cardiovascular Disease

## 2019-08-27 ENCOUNTER — Encounter: Payer: Self-pay | Admitting: Cardiovascular Disease

## 2019-08-27 VITALS — BP 122/70 | Ht 59.0 in | Wt 137.0 lb

## 2019-08-27 DIAGNOSIS — K219 Gastro-esophageal reflux disease without esophagitis: Secondary | ICD-10-CM

## 2019-08-27 DIAGNOSIS — I10 Essential (primary) hypertension: Secondary | ICD-10-CM

## 2019-08-27 DIAGNOSIS — I2510 Atherosclerotic heart disease of native coronary artery without angina pectoris: Secondary | ICD-10-CM

## 2019-08-27 DIAGNOSIS — E785 Hyperlipidemia, unspecified: Secondary | ICD-10-CM

## 2019-08-27 DIAGNOSIS — I701 Atherosclerosis of renal artery: Secondary | ICD-10-CM

## 2019-08-27 NOTE — Patient Instructions (Signed)
Medication Instructions:  Your physician recommends that you continue on your current medications as directed. Please refer to the Current Medication list given to you today.  *If you need a refill on your cardiac medications before your next appointment, please call your pharmacy*  Follow-Up: At [facility], you and your health needs are our priority.  As part of our continuing mission to provide you with exceptional heart care, we have created designated Provider Care Teams.  These Care Teams include your primary Cardiologist (physician) and Advanced Practice Providers (APPs -  Physician Assistants and Nurse Practitioners) who all work together to provide you with the care you need, when you need it.  Your next appointment:   1 year(s)  The format for your next appointment:   In Person  Provider:   You may see Nicki [place], MD or one of the following Advanced Practice Providers on your designated Care Team:    Azalee Course, PA-C  Micah Flesher, PA-C or   Judy Pimple, [place]

## 2019-08-27 NOTE — Progress Notes (Signed)
Virtual Visit via Telephone Note   This visit type was conducted due to national recommendations for restrictions regarding the COVID-19 Pandemic (e.g. social distancing) in an effort to limit this patient's exposure and mitigate transmission in our community.  Due to her co-morbid illnesses, this patient is at least at moderate risk for complications without adequate follow up.  This format is felt to be most appropriate for this patient at this time.  The patient did not have access to video technology/had technical difficulties with video requiring transitioning to audio format only (telephone).  All issues noted in this document were discussed and addressed.  No physical exam could be performed with this format.  Please refer to the patient's chart for her  consent to telehealth for [facility].   Date:  08/27/2019   ID:  Jamie Mejia, DOB 07-22-1941, MRN [id number]  Patient Location: Home Provider Location: Home  PCP:  Andi Devon, MD  Cardiologist:  Nicki Guadalajara, MD Electrophysiologist:  None   Evaluation Performed:  Follow-Up Visit  Chief Complaint:  15 month F/U  History of Present Illness:    Jamie Mejia is a 79 y.o. female has established coronary artery disease and underwent stenting of her proximal RCA in August 2008.  She had concomitant CAD with 60-70% distal narrowing, 50-70% circumflex O2 stenosis, 50% stenosis in the stomach LAD vessel. She did have coronary calcification involving her left main and LAD system. She underwent a two-year followup nuclear perfusion study in October 2013 which continued to be stable without scar or ischemia. She had hyperdynamic LV function.  Additional problems include hypertension,hyperlipidemia, as well as mild renal artery stenosis by duplex imaging. She was felt to have narrowing in the 1-59% range in her left renal artery with a proximal PSV of 196.  Her husband unfortunately died in 2014-10-05.  She is still  having difficulty with this.  When I last saw her in July 2017.  She denied any  recurrent anginal symptoms.  She was taking Toprol-XL 25 mg and isosorbide 60 mg daily.  She denies any dizziness or palpitations.  She was diabetic on metformin.  She had hyperlipidemia and and was taking simvastatin 20 mg.  She had issues with GERD and I suggested over-the-counter Zantac.  I saw her in October 2018 at which time she was doing well and denied any anginal symptomatology..  She had a fall in January 2018 where she may have tripped and apparently fractured her shoulder and had a dislocation.   I last saw her in October 2019 at which time she continued to do well.  She has GERD which in the past had been controlled with Zantac.  However, due to the recent recall for generic over-the-counter Zantac she stopped taking this and was questioning what she should take presently.  She will be seeing her primary physician Dr. Kellie Shropshire in November for complete laboratory.  She denies recurrent angina or palpitations, PND orthopnea.   Since I last saw her, she has continued to see Dr. [name] every 3 months.  She was on my schedule for January for but at that time I was running behind and ultimately she had to leave without being seen.  She is being seen in a telemedicine evaluation today.  Since I last saw her, she has remained stable and specifically denies any recurrent anginal symptomatology.  She has been using Pepcid for GERD and notes prompt relief.  She has noticed more GERD recently.  She  specifically denies any of her prior anginal symptoms.  She apparently had lab work recently done by Dr. [name] and she was told it was A.  Appears that her total cholesterol was 205, triglycerides 136, and LDL cholesterol 122.  She had been on simvastatin 20 mg but this will be changed per Dr. [name] to rosuvastatin.  The patient has not yet had received the prescription change.  She has been trying to resume exercise with  walking.  She is unaware of palpitations.  She denies presyncope or syncope.  She presents for evaluation.  The patient does have symptoms concerning for COVID-19 infection (fever, chills, cough, or new shortness of breath).    Past Medical History:  Diagnosis Date  . 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 Coronary artery disease   . Fatigue due to excessive exertion   . Hyperlipemia   . Hypertension   . SOB (shortness of breath)    Past Surgical History:  Procedure Laterality Date  . coronary artery stenting     stenting to mid right coronary artery at which time was noted  to have concomitant coronary artery disease with 60% to 70% distal narrowing, 50%to 70% circumflex OM2 stenosis, and 50% stenosis in a twin-like LAD segment  . renal duplex scan     left proximal PSV velocity of 196, mid velocity of 197 and distal velocity of 110 suggesting a 1% 10 59% diameter reduction     Current Meds  Medication Sig  . acetaminophen (TYLENOL) 500 MG tablet Take 1,000 mg by mouth every 6 (six) hours as needed for mild pain, moderate pain or headache.   . ALPRAZolam (XANAX) 1 MG tablet Take 0.5 mg by mouth at bedtime as needed for sleep.   . B Complex-C (B-COMPLEX WITH VITAMIN C) tablet Take 1 tablet by mouth daily.  Marland Kitchen buPROPion (WELLBUTRIN XL) 150 MG 24 hr tablet Take 150 mg by mouth daily.  . Calcium Carbonate-Vitamin D (CALCIUM 600+D) 600-400 MG-UNIT tablet Take 1 tablet by mouth daily.  . Cholecalciferol (D3-1000 PO) Take 1 capsule by mouth daily.  . clopidogrel (PLAVIX) 75 MG tablet TAKE 1 TABLET BY MOUTH EVERY DAY  . clopidogrel (PLAVIX) 75 MG tablet TAKE 1 TABLET BY MOUTH EVERY DAY  . Coenzyme Q10 (CO Q-10) 400 MG CAPS Take by mouth.  . famotidine (PEPCID) 20 MG tablet Take 1 tablet (20 mg total) by mouth 2 (two) times daily. OTC  . isosorbide mononitrate (IMDUR) 60 MG 24 hr tablet TAKE 1 TABLET BY MOUTH EVERY DAY  . loratadine (CLARITIN) 10 MG tablet Take 10 mg by  mouth daily as needed for allergies.   . Magnesium-Zinc (MAGNESIUM-CHELATED ZINC PO) Take by mouth.  . metoprolol succinate (TOPROL-XL) 25 MG 24 hr tablet TAKE 1 TABLET BY MOUTH EVERY DAY  . Probiotic Product (ALIGN) 4 MG CAPS Take 4 mg by mouth daily.  . simvastatin (ZOCOR) 20 MG tablet TAKE 1 TABLET BY MOUTH EVERYDAY AT BEDTIME  . TURMERIC PO Take 1 capsule by mouth daily.      Allergies:   Latex and Other   Social History   Tobacco Use  . Smoking status: Former Smoker    Packs/day: 0.75    Years: 43.00    Pack years: 32.25    Types: Cigarettes    Start date: 08/27/1958    Quit date: 04/28/2002    Years since quitting: 17.3  . Smokeless tobacco: Never Used  . Tobacco comment: quit 14 years ago  Substance Use Topics  . Alcohol use: No    Alcohol/week: 0.0 standard drinks  . Drug use: Not on file     Family Hx: The patient's family history includes Heart disease in her brother and mother.  ROS:   Please see the history of present illness.    She denies any fevers chills or night sweats. There is no cough, or loss of taste or smell No wheezing No anginal symptoms No PND orthopnea Positive for GERD Remote history of chronic ITP, no easy bruising No edema No neurologic symptoms Sleeping well All other systems reviewed and are negative.   Prior CV studies:   The following studies were reviewed today: Previous catheterization and nuclear stress data were reviewed.  Labs/Other Tests and Data Reviewed:    EKG:  An ECG dated October 2019 was personally reviewed today and demonstrated:  Normal sinus rhythm at 68 bpm, isolated PAC.  Nonspecific T changes.  Low voltage  Recent Labs: No results found for requested labs within last 8760 hours.   Recent Lipid Panel No results found for: CHOL, TRIG, HDL, CHOLHDL, LDLCALC, LDLDIRECT  Wt Readings from Last 3 Encounters:  08/27/19 137 lb (62.1 kg)  08/02/19 136 lb (61.7 kg)  05/25/18 137 lb (62.1 kg)     Objective:     Vital Signs:  BP 122/70 Comment: taken yesterday at PCP office  Ht 4\' 11"  (1.499 m)   Wt 137 lb (62.1 kg)   BMI 27.67 kg/m    Since this is a telemedicine evaluation I could not physically examine the patient.  Breathing was normal and not labored There was no audible wheezing There was no chest wall tenderness to her palpation Cardiac rhythm was stable without irregularity There was no abdominal discomfort She denied any leg swelling There were no neurologic changes She had a normal affect and mood   ASSESSMENT & PLAN:    1. CAD: She is status post stenting of her RCA in August 2008.  She has been on medical therapy with reference to her concomitant CAD involving the LAD and circumflex vessels.  Nuclear study in October 2013 showed normal perfusion.  She is without current anginal symptoms on her current medical regimen.  She has continued to be on aspirin and clopidogrel.  She is tolerating this well without bleeding. 2. Essential hypertension: Blood pressure today is excellent on isosorbide and long-acting metoprolol.  She recently had an in office visit with Dr. November 2013 and blood pressure was stable. 3. Hyperlipidemia with target LDL less than 70: Recent laboratory from Dr. [name] has shown total cholesterol 205, triglycerides 137, and LDL cholesterol 122 despite taking simvastatin.  In the past I have suggested changing to rosuvastatin.  Ms. Gunkel states that she was changed to rosuvastatin but she was unsure of the dose.  I reemphasized an LDL target of less than 70 particularly with her concomitant CAD. 4. Mild renal artery stenosis: Last renal duplex has shown 159% narrowing in the left renal artery with proximal PSV of 196.  Blood pressure is stable.  Renal function remained stable. 5. GERD: Controlled with Pepcid.  If she continues to have increased symptomatology this may need to be changed to PPI therapy. 6. History of depression: Currently on Wellbutrin.  I discussed  increasing activity and walking at least 5 days a week for minimum of 30 minutes if at all possible.   COVID-19 Education: The signs and symptoms of COVID-19 were discussed with the patient and how to seek  care for testing (follow up with PCP or arrange E-visit).  The importance of social distancing was discussed today.  Time:   Today, I have spent 20 minutes with the patient with telehealth technology discussing the above problems.     Medication Adjustments/Labs and Tests Ordered: Current medicines are reviewed at length with the patient today.  Concerns regarding medicines are outlined above.   Tests Ordered: No orders of the defined types were placed in this encounter.   Medication Changes: No orders of the defined types were placed in this encounter.   Follow Up:  In Person 1 year  Signed, Shelva Majestic, MD  08/27/2019 10:03 AM    [place]

## 2019-10-04 ENCOUNTER — Other Ambulatory Visit: Payer: Self-pay | Admitting: Cardiovascular Disease

## 2020-06-30 ENCOUNTER — Other Ambulatory Visit: Payer: Self-pay | Admitting: Cardiovascular Disease

## 2020-09-24 ENCOUNTER — Other Ambulatory Visit: Payer: Self-pay | Admitting: Cardiovascular Disease

## 2020-11-29 ENCOUNTER — Other Ambulatory Visit: Payer: Self-pay | Admitting: Cardiovascular Disease

## 2021-04-18 ENCOUNTER — Other Ambulatory Visit: Payer: Self-pay | Admitting: Cardiovascular Disease

## 2021-04-26 ENCOUNTER — Other Ambulatory Visit: Payer: Self-pay | Admitting: Cardiovascular Disease

## 2021-04-26 ENCOUNTER — Telehealth: Payer: Self-pay | Admitting: Cardiovascular Disease

## 2021-04-26 NOTE — Telephone Encounter (Signed)
*  STAT* If patient is at the pharmacy, call can be transferred to refill team.   1. Which medications need to be refilled? (please list name of each medication and dose if known)  metoprolol succinate (TOPROL-XL) 25 MG 24 hr tablet  2. Which pharmacy/location (including street and city if local pharmacy) is medication to be sent to?  [id number] - [place], [place] - [address]  3. Do they need a 30 day or 90 day supply? 90 ds

## 2021-05-14 ENCOUNTER — Telehealth: Payer: Self-pay | Admitting: Cardiovascular Disease

## 2021-05-15 NOTE — Telephone Encounter (Signed)
*  STAT* If patient is at the pharmacy, call can be transferred to refill team.   1. Which medications need to be refilled? (please list name of each medication and dose if known) metoprolol succinate (TOPROL-XL) 25 MG 24 hr tablet  2. Which pharmacy/location (including street and city if local pharmacy) is medication to be sent to? [id number] - [place], [place] - [address]  3. Do they need a 30 day or 90 day supply? 90 day  Patient is out of meds

## 2021-06-11 ENCOUNTER — Other Ambulatory Visit: Payer: Self-pay | Admitting: Cardiovascular Disease

## 2021-06-13 NOTE — Progress Notes (Signed)
Cardiology Office Note:    Date:  06/18/2021   ID:  Jamie Mejia, DOB 1941-02-05, MRN [id number]  PCP:  Andi Devon, MD  Cardiologist:  Nicki Guadalajara, MD   Referring MD: Andi Devon, MD   Chief Complaint  Patient presents with   Follow-up    CAD    History of Present Illness:    Jamie Mejia is a 80 y.o. female with a hx of CAD, hypertension, hyperlipidemia, and mild renal artery stenosis by duplex imaging.  Heart cath in August 2008 resulted in stenting of her proximal RCA.  She had residual nonobstructive CAD in the RCA, circumflex, and LAD.  She did have coronary calcifications in the left main.  A 2-year follow-up nuclear stress test in October 2013 was stable without scar or ischemia.  She has been managed medically.  She was last seen by Dr. Tresa Endo in January 2021.  She was doing well at that time without angina.  She presents today for follow-up. She reports she is able to walk a mile, but doesn't do so regularly. She does grocery shop and carries in from the car. She has someone to help clean her house. She has no cardiac complaints. She denies angina, dyspnea, orthopnea, PND, palpitations, and syncope. She remains on plavix monotherapy for left main disease. She reports having mild memory issues. I have encouraged her to speak with her PCP regarding memory issues.    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   Coronary artery disease    Fatigue due to excessive exertion    Hyperlipemia    Hypertension    SOB (shortness of breath)     Past Surgical History:  Procedure Laterality Date   coronary artery stenting     stenting to mid right coronary artery at which time was noted  to have concomitant coronary artery disease with 60% to 70% distal narrowing, 50%to 70% circumflex OM2 stenosis, and 50% stenosis in a twin-like LAD segment   renal duplex scan     left proximal PSV velocity of 196, mid  velocity of 197 and distal velocity of 110 suggesting a 1% 10 59% diameter reduction    Current Medications: Current Meds  Medication Sig   B Complex-C (B-COMPLEX WITH VITAMIN C) tablet Take 1 tablet by mouth daily.   buPROPion (WELLBUTRIN XL) 150 MG 24 hr tablet Take 150 mg by mouth daily.   Calcium Carbonate-Vitamin D 600-400 MG-UNIT tablet Take 1 tablet by mouth daily.   clopidogrel (PLAVIX) 75 MG tablet TAKE 1 TABLET BY MOUTH EVERY DAY   Coenzyme Q10 (CO Q-10) 400 MG CAPS Take by mouth.   famotidine (PEPCID) 20 MG tablet Take 1 tablet (20 mg total) by mouth 2 (two) times daily. OTC   isosorbide mononitrate (IMDUR) 60 MG 24 hr tablet TAKE 1 TABLET BY MOUTH EVERY DAY   metoprolol succinate (TOPROL-XL) 25 MG 24 hr tablet Take 1 tablet (25 mg total) by mouth daily. KEEP OV.   Probiotic Product (ALIGN) 4 MG CAPS Take 4 mg by mouth daily.   rosuvastatin (CRESTOR) 20 MG tablet rosuvastatin 20 mg tablet  TAKE 1 TABLET BY MOUTH EVERY DAY     Allergies:   Latex and Other   Social History   Socioeconomic History   Marital status: Married    Spouse name: Not on file   Number of children: Not on file   Years of education: Not on file   Highest  education level: Not on file  Occupational History   Not on file  Tobacco Use   Smoking status: Former    Packs/day: 0.75    Years: 43.00    Pack years: 32.25    Types: Cigarettes    Start date: 08/27/1958    Quit date: 04/28/2002    Years since quitting: 19.1   Smokeless tobacco: Never   Tobacco comments:    quit 14 years ago  Substance and Sexual Activity   Alcohol use: No    Alcohol/week: 0.0 standard drinks   Drug use: Not on file   Sexual activity: Not on file  Other Topics Concern   Not on file  Social History Narrative   Not on file   Social Determinants of Health   Financial Resource Strain: Not on file  Food Insecurity: Not on file  Transportation Needs: Not on file  Physical Activity: Not on file  Stress: Not on file   Social Connections: Not on file     Family History: The patient's family history includes Heart disease in her brother and mother.  ROS:   Please see the history of present illness.     All other systems reviewed and are negative.  EKGs/Labs/Other Studies Reviewed:    The following studies were reviewed today:  Nuclear stress test 2013 Low risk scan  EKG:  EKG is  ordered today.  The ekg ordered today demonstrates sinus rhythm with HR 68, T wave flattening thorughout  Recent Labs: No results found for requested labs within last 8760 hours.  Recent Lipid Panel No results found for: CHOL, TRIG, HDL, CHOLHDL, VLDL, LDLCALC, LDLDIRECT  Physical Exam:    VS:  BP 128/62   Pulse 68   Ht 4\' 11"  (1.499 m)   Wt 127 lb 3.2 oz (57.7 kg)   SpO2 98%   BMI 25.69 kg/m     Wt Readings from Last 3 Encounters:  06/18/21 127 lb 3.2 oz (57.7 kg)  08/27/19 137 lb (62.1 kg)  08/02/19 136 lb (61.7 kg)     GEN:  Well nourished, well developed in no acute distress HEENT: Normal NECK: No JVD; No carotid bruits LYMPHATICS: No lymphadenopathy CARDIAC: RRR, no murmurs, rubs, gallops RESPIRATORY:  Clear to auscultation without rales, wheezing or rhonchi  ABDOMEN: Soft, non-tender, non-distended MUSCULOSKELETAL:  No edema; No deformity  SKIN: Warm and dry NEUROLOGIC:  Alert and oriented x 3 PSYCHIATRIC:  Normal affect   ASSESSMENT:    1. Hyperlipidemia with target LDL less than 70   2. Essential hypertension   3. Coronary artery disease involving native coronary artery of native heart without angina pectoris    PLAN:    In order of problems listed above:  CAD PCI to RCA in 2008 Residual nonobstructive disease in the left system including the left main.  She is maintained on Plavix, beta-blocker, Imdur, and statin. No angina.    Hypertension Medications as above, no changes.    Hyperlipidemia with LDL goal less than 70 Needs updated lipid profile Continue 20 mg  crestor Would generally not recheck given her age. However, she has left main disease. Will ask her to obtain fasting lipids with PCP later this month.   Follow up with Dr. 2009 in 6 months.     Medication Adjustments/Labs and Tests Ordered: Current medicines are reviewed at length with the patient today.  Concerns regarding medicines are outlined above.  Orders Placed This Encounter  Procedures   Lipid panel   EKG  12-Lead   No orders of the defined types were placed in this encounter.   Signed, Marcelino Duster, PA  06/18/2021 10:12 AM    [facility]

## 2021-06-18 ENCOUNTER — Other Ambulatory Visit: Payer: Self-pay

## 2021-06-18 ENCOUNTER — Encounter: Payer: Self-pay | Admitting: Physician Assistant

## 2021-06-18 ENCOUNTER — Ambulatory Visit: Payer: Medicare PPO | Admitting: Physician Assistant

## 2021-06-18 VITALS — BP 128/62 | HR 68 | Ht 59.0 in | Wt 127.2 lb

## 2021-06-18 DIAGNOSIS — I10 Essential (primary) hypertension: Secondary | ICD-10-CM

## 2021-06-18 DIAGNOSIS — I2510 Atherosclerotic heart disease of native coronary artery without angina pectoris: Secondary | ICD-10-CM | POA: Diagnosis not present

## 2021-06-18 DIAGNOSIS — E785 Hyperlipidemia, unspecified: Secondary | ICD-10-CM | POA: Diagnosis not present

## 2021-06-18 NOTE — Patient Instructions (Signed)
Medication Instructions:  Your physician recommends that you continue on your current medications as directed. Please refer to the Current Medication list given to you today.   *If you need a refill on your cardiac medications before your next appointment, please call your pharmacy*   Lab Work: Your physician recommends that you return for lab work in: next week or so for FASTING cholesterol panel.  If you have labs (blood work) drawn today and your tests are completely normal, you will receive your results only by: MyChart Message (if you have MyChart) OR A paper copy in the mail If you have any lab test that is abnormal or we need to change your treatment, we will call you to review the results.   Follow-Up: At [facility] you and your health needs are our priority.  As part of our continuing mission to provide you with exceptional heart care, we have created designated Provider Care Teams.  These Care Teams include your primary Cardiologist (physician) and Advanced Practice Providers (APPs -  Physician Assistants and Nurse Practitioners) who all work together to provide you with the care you need, when you need it.  We recommend signing up for the patient portal called "MyChart".  Sign up information is provided on this After Visit Summary.  MyChart is used to connect with patients for Virtual Visits (Telemedicine).  Patients are able to view lab/test results, encounter notes, upcoming appointments, etc.  Non-urgent messages can be sent to your provider as well.   To learn more about what you can do with MyChart, go to [email or web address].    Your next appointment:   12 month(s)  The format for your next appointment:   In Person  Provider:   Nicki Guadalajara, MD

## 2021-08-09 ENCOUNTER — Other Ambulatory Visit: Payer: Self-pay | Admitting: Cardiovascular Disease

## 2021-10-15 ENCOUNTER — Ambulatory Visit: Payer: Medicare PPO | Admitting: Cardiovascular Disease

## 2022-01-02 ENCOUNTER — Ambulatory Visit (INDEPENDENT_AMBULATORY_CARE_PROVIDER_SITE_OTHER): Payer: Medicare PPO

## 2022-01-02 ENCOUNTER — Ambulatory Visit: Payer: Medicare PPO | Admitting: Podiatry

## 2022-01-02 DIAGNOSIS — M778 Other enthesopathies, not elsewhere classified: Secondary | ICD-10-CM

## 2022-01-02 DIAGNOSIS — M779 Enthesopathy, unspecified: Secondary | ICD-10-CM

## 2022-01-02 DIAGNOSIS — B353 Tinea pedis: Secondary | ICD-10-CM

## 2022-01-02 MED ORDER — CLOTRIMAZOLE-BETAMETHASONE 1-0.05 % EX CREA: 1.0000 "application " | TOPICAL_CREAM | Freq: Two times a day (BID) | CUTANEOUS | 1 refills | Status: AC

## 2022-01-02 NOTE — Progress Notes (Signed)
   HPI: 81 y.o. female presenting today as a new patient for concern of itching with peeling of the skin to the right foot.  Patient noticed this about 1 month ago and it has slowly progressively gotten worse.  She has been applying some OTC lotion with no improvement.  She says it is very itchy with burning sensation.  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   Coronary artery disease    Fatigue due to excessive exertion    Hyperlipemia    Hypertension    SOB (shortness of breath)     Past Surgical History:  Procedure Laterality Date   coronary artery stenting     stenting to mid right coronary artery at which time was noted  to have concomitant coronary artery disease with 60% to 70% distal narrowing, 50%to 70% circumflex OM2 stenosis, and 50% stenosis in a twin-like LAD segment   renal duplex scan     left proximal PSV velocity of 196, mid velocity of 197 and distal velocity of 110 suggesting a 1% 10 59% diameter reduction    Allergies  Allergen Reactions   Latex Itching   Other Hives and Other (See Comments)    Pt is allergic to peanut butter.      Physical Exam: General: The patient is alert and oriented x3 in no acute distress.  Dermatology: Skin is warm, dry and supple bilateral lower extremities.  Diffuse hyperkeratosis with peeling of skin and superficial fissuring noted along the weightbearing surface of the foot  Vascular: Palpable pedal pulses bilaterally. Capillary refill within normal limits.  Negative for any significant edema or erythema  Neurological: Light touch and protective threshold grossly intact  Musculoskeletal Exam: No pedal deformities noted  Radiographic Exam:  Normal osseous mineralization given the patient's age.  Diffuse degenerative changes noted.  Assessment: 1.  Tinea pedis/dermatitis right foot   Plan of Care:  1. Patient evaluated. X-Rays reviewed.  2.  Prescription for  Lotrisone cream apply 2 times daily 3.  Recommend good foot hygiene 4.  Return to clinic in 1 month      Felecia Shelling, DPM [facility]  Dr. Felecia Shelling, DPM    [address] [place], [place] [address]                Office [phone]  Fax [phone]

## 2022-01-09 ENCOUNTER — Other Ambulatory Visit: Payer: Self-pay | Admitting: Cardiovascular Disease

## 2022-01-09 ENCOUNTER — Other Ambulatory Visit: Payer: Self-pay | Admitting: Podiatry

## 2022-01-09 DIAGNOSIS — M778 Other enthesopathies, not elsewhere classified: Secondary | ICD-10-CM

## 2022-02-04 ENCOUNTER — Ambulatory Visit: Payer: Medicare PPO | Admitting: Podiatry

## 2022-02-04 DIAGNOSIS — B353 Tinea pedis: Secondary | ICD-10-CM | POA: Diagnosis not present

## 2022-02-04 NOTE — Progress Notes (Signed)
   HPI: 81 y.o. female presenting today for follow-up evaluation of tinea pedis to the right foot.  Last visit Lotrisone cream was prescribed.  She says that it is significantly better with the Lotrisone cream.  She has been applying it 2 times daily.  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   Coronary artery disease    Fatigue due to excessive exertion    Hyperlipemia    Hypertension    SOB (shortness of breath)     Past Surgical History:  Procedure Laterality Date   coronary artery stenting     stenting to mid right coronary artery at which time was noted  to have concomitant coronary artery disease with 60% to 70% distal narrowing, 50%to 70% circumflex OM2 stenosis, and 50% stenosis in a twin-like LAD segment   renal duplex scan     left proximal PSV velocity of 196, mid velocity of 197 and distal velocity of 110 suggesting a 1% 10 59% diameter reduction    Allergies  Allergen Reactions   Latex Itching   Other Hives and Other (See Comments)    Pt is allergic to peanut butter.      Physical Exam: General: The patient is alert and oriented x3 in no acute distress.  Dermatology: There continues to be some slightly dry peeling skin to the weightbearing surfaces of the right foot consistent with a moccasin type tinea pedis..  Vascular: Palpable pedal pulses bilaterally. Capillary refill within normal limits.  Negative for any significant edema or erythema  Neurological: Light touch and protective threshold grossly intact  Musculoskeletal Exam: No pedal deformities noted   Assessment: 1.  Tinea pedis right foot; improved   Plan of Care:  1. Patient evaluated. 2.  Continue the Lotrisone cream 2 times daily 3.  Foot miracle lotion dispensed at checkout 4.  Continue wearing good supportive shoes and sneakers 5.  Return to clinic as needed     Felecia Shelling, DPM [facility]  Dr. Felecia Shelling, DPM    [address] [place], [place] [address]                Office [phone]  Fax [phone]

## 2022-02-12 ENCOUNTER — Other Ambulatory Visit: Payer: Self-pay | Admitting: Cardiovascular Disease

## 2022-07-12 ENCOUNTER — Other Ambulatory Visit: Payer: Self-pay | Admitting: Cardiovascular Disease

## 2022-08-17 ENCOUNTER — Other Ambulatory Visit: Payer: Self-pay | Admitting: Cardiovascular Disease

## 2022-10-20 ENCOUNTER — Other Ambulatory Visit: Payer: Self-pay | Admitting: Cardiovascular Disease

## 2022-11-04 ENCOUNTER — Ambulatory Visit: Payer: Medicare PPO | Attending: Cardiovascular Disease | Admitting: Cardiovascular Disease

## 2022-11-04 VITALS — BP 128/76 | HR 72 | Ht 59.0 in | Wt 124.8 lb

## 2022-11-04 DIAGNOSIS — I701 Atherosclerosis of renal artery: Secondary | ICD-10-CM | POA: Diagnosis not present

## 2022-11-04 DIAGNOSIS — E785 Hyperlipidemia, unspecified: Secondary | ICD-10-CM | POA: Diagnosis not present

## 2022-11-04 DIAGNOSIS — I10 Essential (primary) hypertension: Secondary | ICD-10-CM

## 2022-11-04 DIAGNOSIS — I2510 Atherosclerotic heart disease of native coronary artery without angina pectoris: Secondary | ICD-10-CM | POA: Diagnosis not present

## 2022-11-04 DIAGNOSIS — F32A Depression, unspecified: Secondary | ICD-10-CM

## 2022-11-04 MED ORDER — HYDROCHLOROTHIAZIDE 12.5 MG PO CAPS: 12.5000 mg | ORAL_CAPSULE | Freq: Every day | ORAL | 3 refills | Status: DC | PRN

## 2022-11-04 NOTE — Progress Notes (Signed)
Cardiology Office Note    Date:  11/09/2022   ID:  Jamie Mejia, DOB 06/22/1941, MRN [id number]  PCP:  Andi DevonShelton, Kimberly, MD  Cardiologist:  Nicki Guadalajarahomas Jacarri Gesner, MD   39 month F/U evaluation.   History of Present Illness:  Jamie Mejia is a 82 y.o. female who has established coronary artery disease and underwent stenting of her proximal RCA in August 2008.  She had concomitant CAD with 60-70% distal narrowing, 50-70% circumflex O2 stenosis, 50% stenosis in the stomach LAD vessel. She did have coronary calcification involving her left main and LAD system. She underwent a two-year followup nuclear perfusion study in October 2013 which continued to be stable without scar or ischemia. She had hyperdynamic LV function.   Additional problems include hypertension,hyperlipidemia, as well as mild renal artery stenosis by duplex imaging. She was felt to have narrowing in the 1-59% range in her left renal artery with a proximal PSV of 196.   Her husband unfortunately died in February 2016.  She is still having difficulty with this. When I last saw her in July 2017.  She denied any  recurrent anginal symptoms.  She was taking Toprol-XL 25 mg and isosorbide 60 mg daily.  She denies any dizziness or palpitations.  She was diabetic on metformin.  She had hyperlipidemia and and was taking simvastatin 20 mg.  She had issues with GERD and I suggested over-the-counter Zantac.   I saw her in October 2018 at which time she was doing well and denied any anginal symptomatology..  She had a fall in January 2018 where she may have tripped and apparently fractured her shoulder and had a dislocation.    I saw her in October 2019 at which time she continued to do well.  She has GERD which in the past had been controlled with Zantac.  However, due to the recent recall for generic over-the-counter Zantac she stopped taking this and was questioning what she should take presently.  She will be seeing her primary  physician Dr. Kellie ShropshireKim Hanif in November for complete laboratory.  She denies recurrent angina or palpitations, PND orthopnea.    Since I last saw her, she has continued to see Dr. Renae GlossShelton every 3 months.   I last evaluated her in a telemedicine visit on August 27, 2019. Since I last saw her, she has remained stable and specifically denies any recurrent anginal symptomatology.  She has been using Pepcid for GERD and notes prompt relief.  She has noticed more GERD recently.  She specifically denies any of her prior anginal symptoms.  She apparently had lab work recently done by Dr. Renae GlossShelton and she was told it was A.  Appears that her total cholesterol was 205, triglycerides 136, and LDL cholesterol 122.  She had been on simvastatin 20 mg but this will be changed per Dr. Renae GlossShelton to rosuvastatin.  The patient has not yet had received the prescription change.  She has been trying to resume exercise with walking.  She is unaware of palpitations.    She was evaluated by Micah FlesherAngela Duke, PA on June 18, 2021.  She was without cardiac complaints.  She was was still able to walk approximately a mile but was not doing so regularly.  She was having help in cleaning her house.  She was having mild memory issues.    Presently, she denies any chest pain or shortness of breath.  She has been using a walker for ambulation.  She  continues to have some stress both with losing her daughter in 2006 at age 82 and losing her husband in 2016.  She admits to occasional ankle swelling left greater than right.  She continues to be on isosorbide 60 mg, metoprolol succinate 25 mg for her CAD.  She has continued to be on antiplatelet therapy.  She is on rosuvastatin 20 mg for hyperlipidemia and takes bupropion for depression.  She presents for evaluation.    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   Coronary artery disease    Fatigue due to excessive exertion    Hyperlipemia     Hypertension    SOB (shortness of breath)     Past Surgical History:  Procedure Laterality Date   coronary artery stenting     stenting to mid right coronary artery at which time was noted  to have concomitant coronary artery disease with 60% to 70% distal narrowing, 50%to 70% circumflex OM2 stenosis, and 50% stenosis in a twin-like LAD segment   renal duplex scan     left proximal PSV velocity of 196, mid velocity of 197 and distal velocity of 110 suggesting a 1% 10 59% diameter reduction    Current Medications: Outpatient Medications Prior to Visit  Medication Sig Dispense Refill   acetaminophen (TYLENOL) 500 MG tablet Take 1,000 mg by mouth every 6 (six) hours as needed for mild pain, moderate pain or headache.     B Complex-C (B-COMPLEX WITH VITAMIN C) tablet Take 1 tablet by mouth daily.     buPROPion (WELLBUTRIN XL) 150 MG 24 hr tablet Take 150 mg by mouth daily.     Calcium Carbonate-Vitamin D 600-400 MG-UNIT tablet Take 1 tablet by mouth daily.     clopidogrel (PLAVIX) 75 MG tablet TAKE 1 TABLET BY MOUTH EVERY DAY 90 tablet 1   clotrimazole-betamethasone (LOTRISONE) cream Apply 1 application. topically 2 (two) times daily. 45 g 1   Coenzyme Q10 (CO Q-10) 400 MG CAPS Take by mouth.     famotidine (PEPCID) 20 MG tablet Take 1 tablet (20 mg total) by mouth 2 (two) times daily. OTC 90 tablet 3   isosorbide mononitrate (IMDUR) 60 MG 24 hr tablet TAKE 1 TABLET BY MOUTH EVERY DAY 90 tablet 0   loratadine (CLARITIN) 10 MG tablet Take 10 mg by mouth daily as needed for allergies.     metoprolol succinate (TOPROL-XL) 25 MG 24 hr tablet TAKE 1 TABLET (25 MG TOTAL) BY MOUTH DAILY. KEEP OV. 90 tablet 3   Probiotic Product (ALIGN) 4 MG CAPS Take 4 mg by mouth daily.     rosuvastatin (CRESTOR) 20 MG tablet rosuvastatin 20 mg tablet  TAKE 1 TABLET BY MOUTH EVERY DAY     TURMERIC PO Take 1 capsule by mouth daily.     Cholecalciferol (D3-1000 PO) Take 1 capsule by mouth daily.      Magnesium-Zinc (MAGNESIUM-CHELATED ZINC PO) Take by mouth.     No facility-administered medications prior to visit.     Allergies:   Latex and Other   Social History   Socioeconomic History   Marital status: Married    Spouse name: Not on file   Number of children: Not on file   Years of education: Not on file   Highest education level: Not on file  Occupational History   Not on file  Tobacco Use   Smoking status: Former    Packs/day: 0.75    Years: 43.00  Additional pack years: 0.00    Total pack years: 32.25    Types: Cigarettes    Start date: 08/27/1958    Quit date: 04/28/2002    Years since quitting: 20.5   Smokeless tobacco: Never   Tobacco comments:    quit 14 years ago  Substance and Sexual Activity   Alcohol use: No    Alcohol/week: 0.0 standard drinks of alcohol   Drug use: Not on file   Sexual activity: Not on file  Other Topics Concern   Not on file  Social History Narrative   Not on file   Social Determinants of Health   Financial Resource Strain: Not on file  Food Insecurity: Not on file  Transportation Needs: Not on file  Physical Activity: Not on file  Stress: Not on file  Social Connections: Not on file     Family History:  The patient's family history includes Heart disease in her brother and mother.   ROS General: Negative; No fevers, chills, or night sweats;  HEENT: Negative; No changes in vision or hearing, sinus congestion, difficulty swallowing Pulmonary: Negative; No cough, wheezing, shortness of breath, hemoptysis Cardiovascular: See HPI GI: Negative; No nausea, vomiting, diarrhea, or abdominal pain GU: Negative; No dysuria, hematuria, or difficulty voiding Musculoskeletal: Negative; no myalgias, joint pain, or weakness Hematologic/Oncology: Negative; no easy bruising, bleeding Endocrine: Negative; no heat/cold intolerance; no diabetes Neuro: Negative; no changes in balance, headaches Skin: Negative; No rashes or skin  lesions Psychiatric: Negative; No behavioral problems, depression Sleep: Negative; No snoring, daytime sleepiness, hypersomnolence, bruxism, restless legs, hypnogognic hallucinations, no cataplexy Other comprehensive 14 point system review is negative.   PHYSICAL EXAM:   VS:  BP 128/76   Pulse 72   Ht 4\' 11"  (1.499 m)   Wt 124 lb 12.8 oz (56.6 kg)   SpO2 97%   BMI 25.21 kg/m     Repeat blood pressure by me was 114/76  Wt Readings from Last 3 Encounters:  11/04/22 124 lb 12.8 oz (56.6 kg)  06/18/21 127 lb 3.2 oz (57.7 kg)  08/27/19 137 lb (62.1 kg)    General: Alert, oriented, no distress.  Skin: normal turgor, no rashes, warm and dry HEENT: Normocephalic, atraumatic. Pupils equal round and reactive to light; sclera anicteric; extraocular muscles intact;  Nose without nasal septal hypertrophy Mouth/Parynx benign; Mallinpatti scale 3 Neck: No JVD, no carotid bruits; normal carotid upstroke Lungs: clear to ausculatation and percussion; no wheezing or rales Chest wall: without tenderness to palpitation Heart: PMI not displaced, RRR, s1 s2 normal, 1/6 systolic murmur, no diastolic murmur, no rubs, gallops, thrills, or heaves Abdomen: soft, nontender; no hepatosplenomehaly, BS+; abdominal aorta nontender and not dilated by palpation. Back: no CVA tenderness Pulses 2+ Musculoskeletal: full range of motion, normal strength, no joint deformities Extremities: Bilateral lower extremity edema, 1+ left ankle, trace right ankle; no clubbing cyanosis, Homan's sign negative  Neurologic: grossly nonfocal; Cranial nerves grossly wnl Psychologic: Normal mood and affect   Studies/Labs Reviewed:   November 04, 2023 ECG (independently read by me): NSR at 72, nonspecific T wave abnormaliity  Recent Labs:    Latest Ref Rng & Units 08/19/2016   12:21 PM 10/27/2013    4:47 PM 11/22/2009    2:58 PM  BMP  Glucose 65 - 99 mg/dL 604  540  981   BUN 6 - 20 mg/dL 16  10  14    Creatinine 0.44 - 1.00  mg/dL 1.91  4.78  2.95   Sodium 135 -  145 mmol/L 139  144  138   Potassium 3.5 - 5.1 mmol/L 4.2  4.2  4.3   Chloride 101 - 111 mmol/L 108  106  106   CO2 22 - 32 mmol/L 21  25  24    Calcium 8.9 - 10.3 mg/dL 9.4  9.4  9.1         Latest Ref Rng & Units 10/27/2013    4:47 PM 11/22/2009    2:58 PM  Hepatic Function  Total Protein 6.0 - 8.3 g/dL 7.2  6.9   Albumin 3.5 - 5.2 g/dL 3.9  3.8   AST 0 - 37 U/L 20  27   ALT 0 - 35 U/L 16  25   Alk Phosphatase 39 - 117 U/L 104  48   Total Bilirubin 0.3 - 1.2 mg/dL 0.4  0.5        Latest Ref Rng & Units 08/19/2016   12:21 PM 02/02/2015   10:17 AM 08/04/2014    9:57 AM  CBC  WBC 4.0 - 10.5 K/uL 11.3  5.5  5.4   Hemoglobin 12.0 - 15.0 g/dL 16.1  09.6  04.5   Hematocrit 36.0 - 46.0 % 43.1  41.8  41.1   Platelets 150 - 400 K/uL 188  225  226    Lab Results  Component Value Date   MCV 93.5 08/19/2016   MCV 94 02/02/2015   MCV 93 08/04/2014   No results found for: "TSH" No results found for: "HGBA1C"   BNP No results found for: "BNP"  ProBNP No results found for: "PROBNP"   Lipid Panel  No results found for: "CHOL", "TRIG", "HDL", "CHOLHDL", "VLDL", "LDLCALC", "LDLDIRECT", "LABVLDL"   RADIOLOGY: No results found.   Additional studies/ records that were reviewed today include:   Records of Micah Flesher, [place] were reviewed   ASSESSMENT:    1. Coronary artery disease involving native coronary artery of native heart without angina pectoris   2. Essential hypertension   3. Hyperlipidemia with target LDL less than 70   4. Mild left renal artery stenosis   5. Depression, unspecified depression type     PLAN:  Ms. Janye Maynor is a 82 year old female who has known CAD and underwent initial stenting of her RCA in August 2008.  She has been on medical therapy for her concomitant CAD and has remained relatively asymptomatic on isosorbide 60 mg daily in addition to metoprolol succinate 25 mg.  Remotely she had undergone nuclear  perfusion studies which were normal.  Her blood pressure today is well-controlled and her repeat by me was 114/76 on her current therapy.  She continues to be on rosuvastatin 20 mg for hyperlipidemia and has laboratory by Dr. [name].  Remotely, she was found to have mild left renal artery narrowing in the range of 1 to 59%.  Her GERD is controlled.  She continues to be on bupropion for depression.  With her ankle edema, I am giving her a prescription for HCTZ 12.5 mg to take on an as-needed basis.  She will be seen by her primary provider Dr. [name] in the near future.  As long as she is stable, I will see her in 1 year for reevaluation or sooner as needed.   Medication Adjustments/Labs and Tests Ordered: Current medicines are reviewed at length with the patient today.  Concerns regarding medicines are outlined above.  Medication changes, Labs and Tests ordered today are listed in the Patient Instructions below. Patient Instructions  Medication Instructions:  Take hydrochlorothiazide (HCTZ) 12.5 mg AS NEEDED for swelling  *If you need a refill on your cardiac medications before your next appointment, please call your pharmacy*  Follow-Up: At [facility], you and your health needs are our priority.  As part of our continuing mission to provide you with exceptional heart care, we have created designated Provider Care Teams.  These Care Teams include your primary Cardiologist (physician) and Advanced Practice Providers (APPs -  Physician Assistants and Nurse Practitioners) who all work together to provide you with the care you need, when you need it.  We recommend signing up for the patient portal called "MyChart".  Sign up information is provided on this After Visit Summary.  MyChart is used to connect with patients for Virtual Visits (Telemedicine).  Patients are able to view lab/test results, encounter notes, upcoming appointments, etc.  Non-urgent messages can be sent to your provider as  well.   To learn more about what you can do with MyChart, go to [email or web address].    Your next appointment:   12 month(s)  Provider:   Nicki Guadalajara, MD        Signed, Nicki Guadalajara, MD  11/09/2022 12:40 PM    [facility] [address], [place], [place]  [address] Phone: [phone]

## 2022-11-04 NOTE — Patient Instructions (Signed)
Medication Instructions:  Take hydrochlorothiazide (HCTZ) 12.5 mg AS NEEDED for swelling  *If you need a refill on your cardiac medications before your next appointment, please call your pharmacy*  Follow-Up: At [facility], you and your health needs are our priority.  As part of our continuing mission to provide you with exceptional heart care, we have created designated Provider Care Teams.  These Care Teams include your primary Cardiologist (physician) and Advanced Practice Providers (APPs -  Physician Assistants and Nurse Practitioners) who all work together to provide you with the care you need, when you need it.  We recommend signing up for the patient portal called "MyChart".  Sign up information is provided on this After Visit Summary.  MyChart is used to connect with patients for Virtual Visits (Telemedicine).  Patients are able to view lab/test results, encounter notes, upcoming appointments, etc.  Non-urgent messages can be sent to your provider as well.   To learn more about what you can do with MyChart, go to [email or web address].    Your next appointment:   12 month(s)  Provider:   Nicki Guadalajara, MD

## 2022-11-09 ENCOUNTER — Encounter: Payer: Self-pay | Admitting: Cardiovascular Disease

## 2023-01-02 ENCOUNTER — Encounter: Payer: Self-pay | Admitting: Cardiovascular Disease

## 2023-01-02 NOTE — Telephone Encounter (Signed)
error 

## 2023-01-30 ENCOUNTER — Other Ambulatory Visit: Payer: Self-pay | Admitting: Cardiovascular Disease

## 2023-08-26 ENCOUNTER — Other Ambulatory Visit: Payer: Self-pay | Admitting: Cardiovascular Disease

## 2024-01-17 ENCOUNTER — Other Ambulatory Visit: Payer: Self-pay | Admitting: Cardiovascular Disease

## 2024-02-11 ENCOUNTER — Other Ambulatory Visit: Payer: Self-pay | Admitting: Cardiovascular Disease
